# Patient Record
Sex: Female | Born: 1950 | Race: White | Hispanic: No | Smoking: Former smoker
Health system: Southern US, Community
[De-identification: ages and names within clinical notes are randomized; demographics above are authoritative.]

## PROBLEM LIST (undated history)

## (undated) DIAGNOSIS — I10 Essential (primary) hypertension: Secondary | ICD-10-CM

## (undated) DIAGNOSIS — R350 Frequency of micturition: Secondary | ICD-10-CM

## (undated) DIAGNOSIS — M1990 Unspecified osteoarthritis, unspecified site: Secondary | ICD-10-CM

## (undated) HISTORY — DX: Unspecified osteoarthritis, unspecified site: M19.90

## (undated) HISTORY — PX: BREAST EXCISIONAL BIOPSY: SUR124

## (undated) HISTORY — DX: Essential (primary) hypertension: I10

## (undated) HISTORY — PX: BREAST BIOPSY: SHX20

## (undated) HISTORY — PX: BREAST CYST EXCISION: SHX579

## (undated) HISTORY — PX: BREAST SURGERY: SHX581

## (undated) HISTORY — PX: APPENDECTOMY: SHX54

## (undated) HISTORY — PX: TONSILLECTOMY: SUR1361

---

## 1997-10-26 ENCOUNTER — Ambulatory Visit (HOSPITAL_COMMUNITY): Admission: RE | Admit: 1997-10-26 | Discharge: 1997-10-26 | Payer: Self-pay | Admitting: Obstetrics & Gynecology

## 1998-05-04 ENCOUNTER — Other Ambulatory Visit: Admission: RE | Admit: 1998-05-04 | Discharge: 1998-05-04 | Payer: Self-pay | Admitting: Obstetrics & Gynecology

## 1999-06-24 ENCOUNTER — Other Ambulatory Visit: Admission: RE | Admit: 1999-06-24 | Discharge: 1999-06-24 | Payer: Self-pay | Admitting: Obstetrics & Gynecology

## 2000-07-22 ENCOUNTER — Other Ambulatory Visit: Admission: RE | Admit: 2000-07-22 | Discharge: 2000-07-22 | Payer: Self-pay | Admitting: Obstetrics & Gynecology

## 2001-08-04 ENCOUNTER — Other Ambulatory Visit: Admission: RE | Admit: 2001-08-04 | Discharge: 2001-08-04 | Payer: Self-pay | Admitting: Obstetrics & Gynecology

## 2001-09-22 ENCOUNTER — Other Ambulatory Visit: Admission: RE | Admit: 2001-09-22 | Discharge: 2001-09-22 | Payer: Self-pay | Admitting: Urology

## 2002-08-24 ENCOUNTER — Other Ambulatory Visit: Admission: RE | Admit: 2002-08-24 | Discharge: 2002-08-24 | Payer: Self-pay | Admitting: Obstetrics & Gynecology

## 2003-09-21 ENCOUNTER — Other Ambulatory Visit: Admission: RE | Admit: 2003-09-21 | Discharge: 2003-09-21 | Payer: Self-pay | Admitting: Obstetrics & Gynecology

## 2004-08-14 ENCOUNTER — Encounter: Admission: RE | Admit: 2004-08-14 | Discharge: 2004-08-14 | Payer: Self-pay | Admitting: Obstetrics & Gynecology

## 2004-11-06 ENCOUNTER — Other Ambulatory Visit: Admission: RE | Admit: 2004-11-06 | Discharge: 2004-11-06 | Payer: Self-pay | Admitting: Obstetrics & Gynecology

## 2007-02-19 ENCOUNTER — Encounter: Admission: RE | Admit: 2007-02-19 | Discharge: 2007-02-19 | Payer: Self-pay | Admitting: Obstetrics & Gynecology

## 2007-03-01 ENCOUNTER — Encounter (INDEPENDENT_AMBULATORY_CARE_PROVIDER_SITE_OTHER): Payer: Self-pay | Admitting: Diagnostic Radiology

## 2007-03-01 ENCOUNTER — Encounter: Admission: RE | Admit: 2007-03-01 | Discharge: 2007-03-01 | Payer: Self-pay | Admitting: Obstetrics & Gynecology

## 2008-12-30 IMAGING — MG MM DIAGNOSTIC UNILATERAL R
2 series · 2 of 2 positions shown · non-contrast
Comparison: none

DG DIAGNOSTIC UNILATERAL R
CC and MLO view(s) were taken of the right breast.

DIGITAL UNILATERAL RIGHT DIAGNOSTIC MAMMOGRAM:
CLINICAL DATA: Abnormal screening study from [REDACTED] dated 02-10-07.

[R CC]
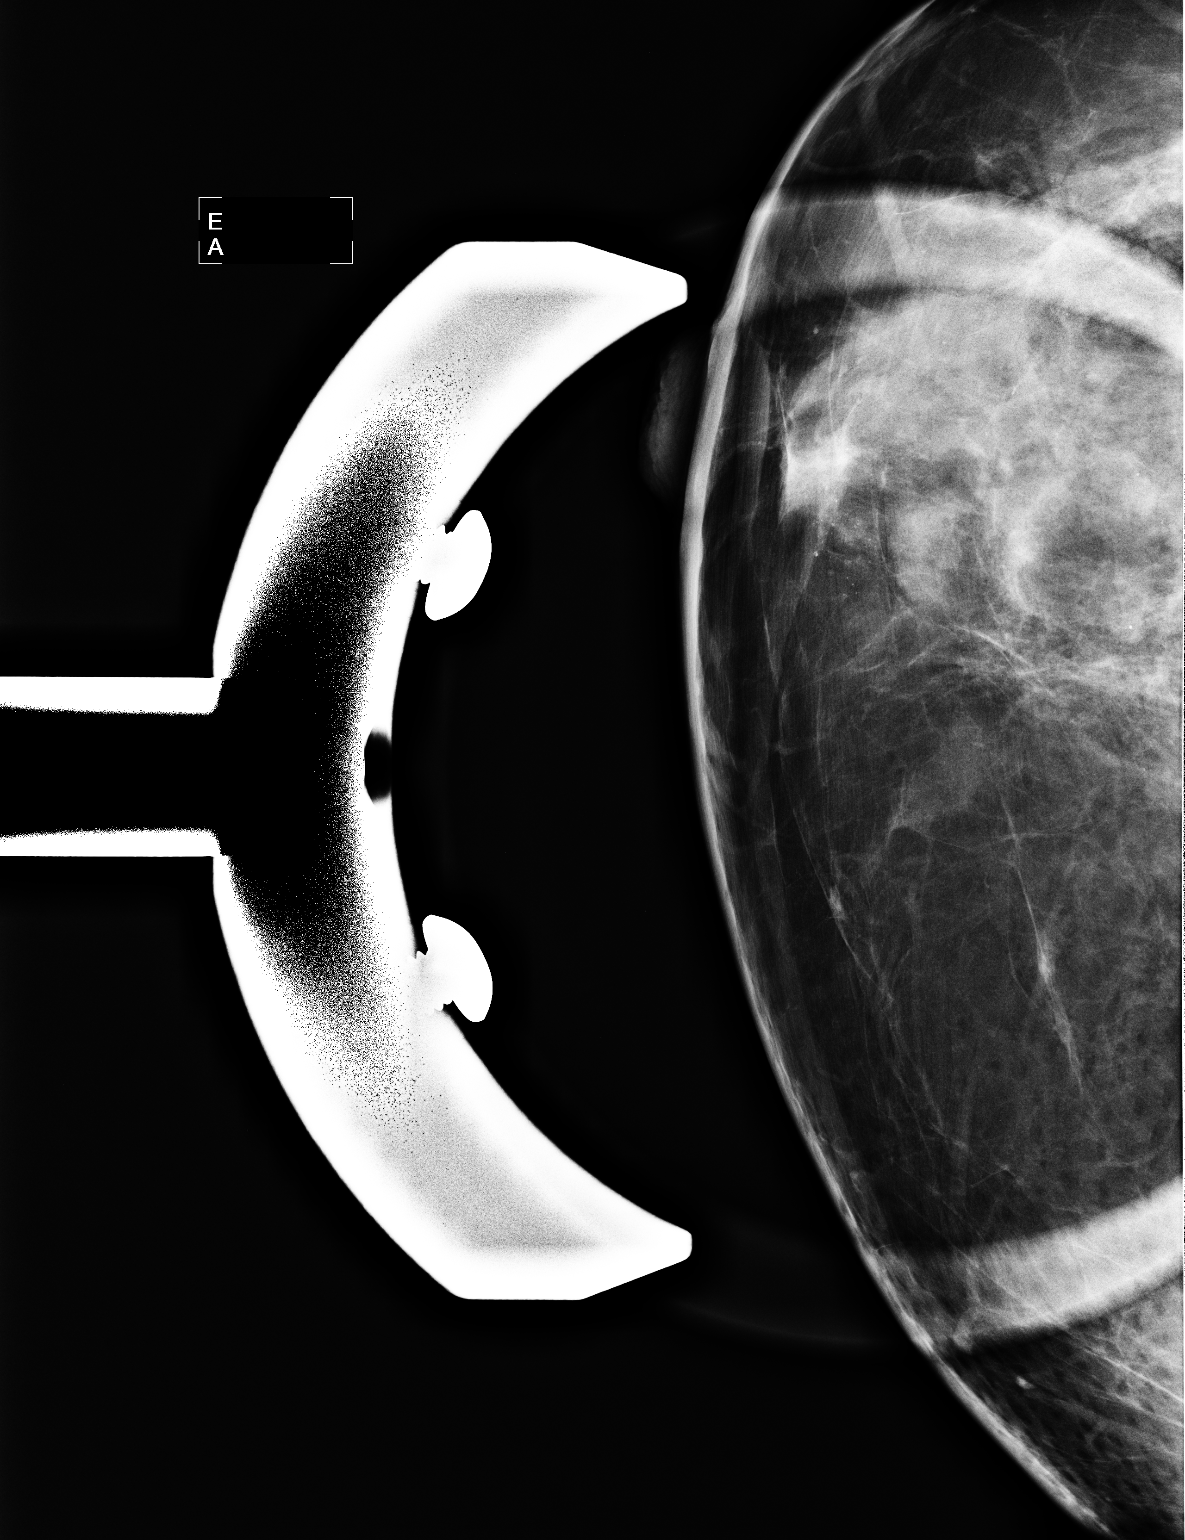

[R MLO]
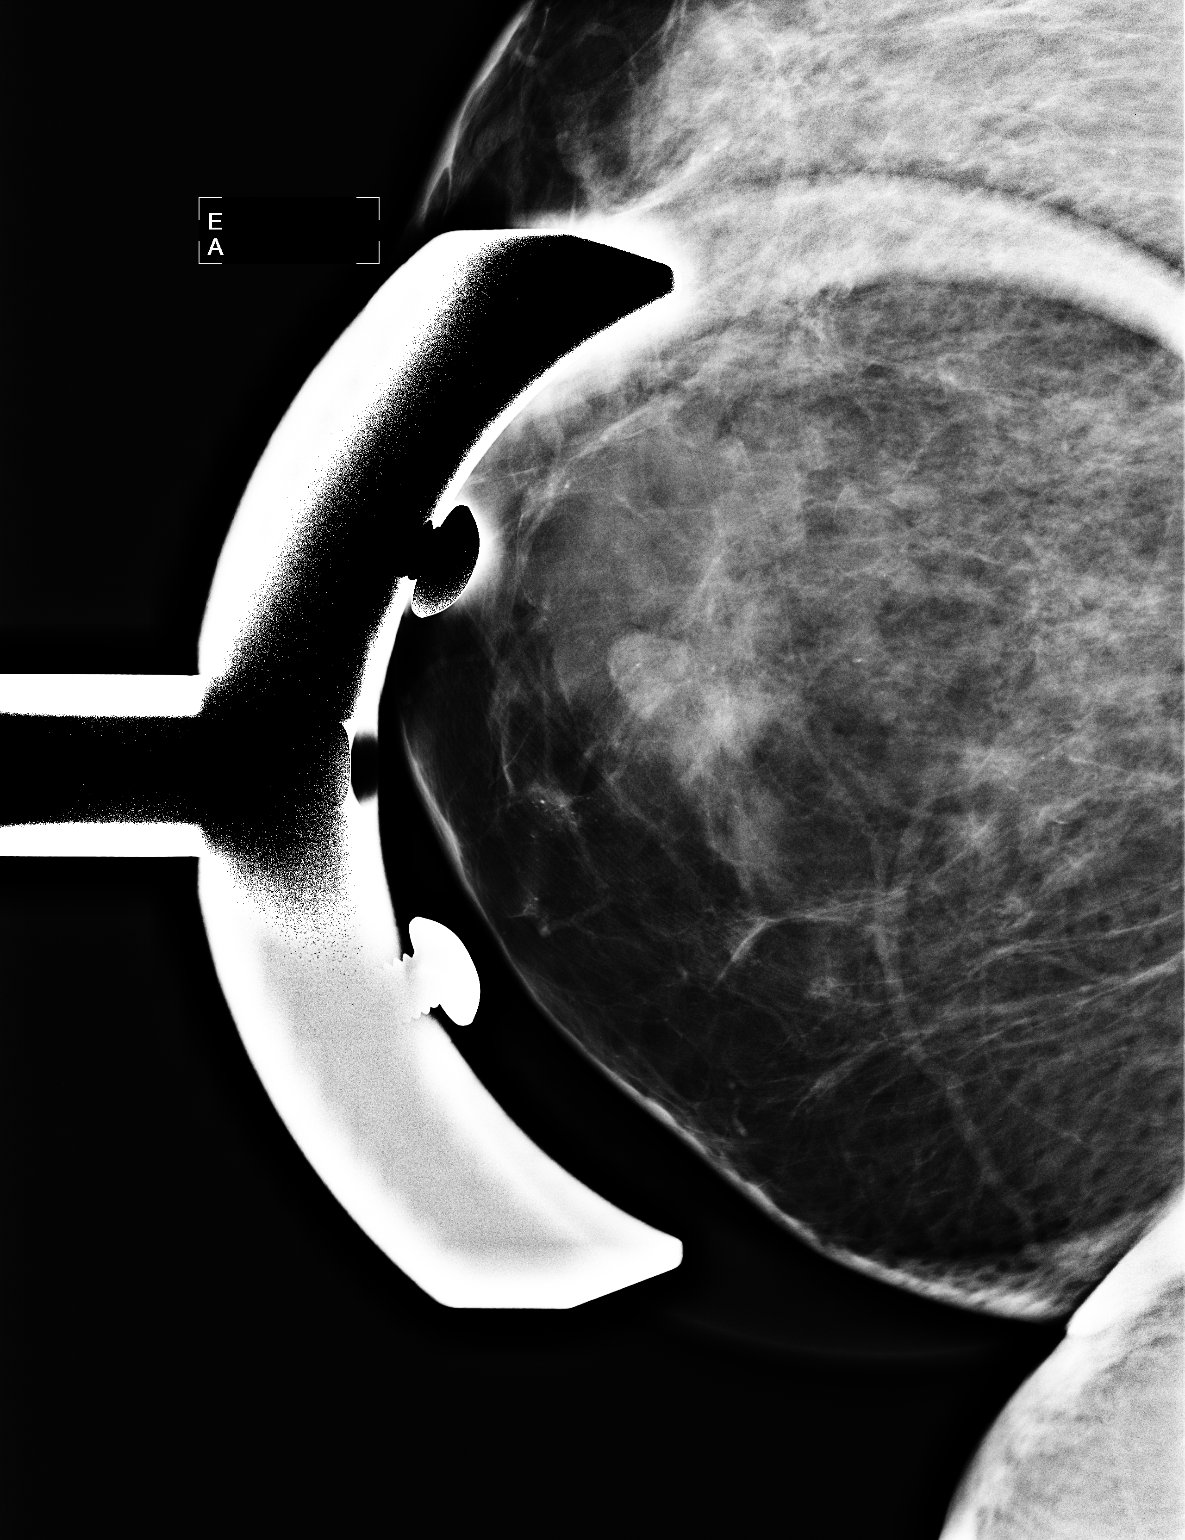

[2 of 2 positions shown; findings below may reference images not displayed]

Magnification views of the lower inner quadrant in the right breast was performed. There is a 
cluster of calcifications.  They vary in size and shape and are concerning for ductal carcinoma in 
situ.  There is no associated mass.  Comparison studies are recent screening mammogram dated 
02-10-07 and prior studies dated 09-16-05 and 07-24-04.
IMPRESSION: Suspicious calcifications, right breast.  Tissue sampling is recommended.  Stereotactic biopsy will
be scheduled.  Findings were discussed with the patient and called to Dr. [REDACTED].

ASSESSMENT: Suspicious - BI-RADS 4

Needle biopsy of the right breast.
, THIS PROCEDURE WAS A DIGITAL MAMMOGRAM.

## 2011-07-09 ENCOUNTER — Other Ambulatory Visit: Payer: Self-pay | Admitting: Obstetrics & Gynecology

## 2011-07-09 DIAGNOSIS — R928 Other abnormal and inconclusive findings on diagnostic imaging of breast: Secondary | ICD-10-CM

## 2011-07-14 ENCOUNTER — Other Ambulatory Visit: Payer: Self-pay | Admitting: Obstetrics & Gynecology

## 2011-07-14 ENCOUNTER — Ambulatory Visit
Admission: RE | Admit: 2011-07-14 | Discharge: 2011-07-14 | Disposition: A | Discharge status: Home / Self Care | Payer: BC Managed Care – PPO | Source: Ambulatory Visit | Attending: Obstetrics & Gynecology | Admitting: Obstetrics & Gynecology

## 2011-07-14 DIAGNOSIS — R928 Other abnormal and inconclusive findings on diagnostic imaging of breast: Secondary | ICD-10-CM

## 2012-07-20 ENCOUNTER — Other Ambulatory Visit: Payer: Self-pay | Admitting: Obstetrics & Gynecology

## 2012-07-20 DIAGNOSIS — R928 Other abnormal and inconclusive findings on diagnostic imaging of breast: Secondary | ICD-10-CM

## 2012-08-03 ENCOUNTER — Ambulatory Visit
Admission: RE | Admit: 2012-08-03 | Discharge: 2012-08-03 | Disposition: A | Discharge status: Home / Self Care | Payer: BC Managed Care – PPO | Source: Ambulatory Visit | Attending: Obstetrics & Gynecology | Admitting: Obstetrics & Gynecology

## 2012-08-03 DIAGNOSIS — R928 Other abnormal and inconclusive findings on diagnostic imaging of breast: Secondary | ICD-10-CM

## 2013-05-03 ENCOUNTER — Other Ambulatory Visit: Payer: Self-pay

## 2013-05-03 DIAGNOSIS — Z1231 Encounter for screening mammogram for malignant neoplasm of breast: Secondary | ICD-10-CM

## 2013-07-13 ENCOUNTER — Ambulatory Visit
Admission: RE | Admit: 2013-07-13 | Discharge: 2013-07-13 | Disposition: A | Discharge status: Home / Self Care | Payer: BC Managed Care – PPO | Source: Ambulatory Visit

## 2013-07-13 DIAGNOSIS — Z1231 Encounter for screening mammogram for malignant neoplasm of breast: Secondary | ICD-10-CM

## 2014-09-27 ENCOUNTER — Other Ambulatory Visit: Payer: Self-pay | Admitting: Obstetrics & Gynecology

## 2014-09-28 LAB — CYTOLOGY - PAP

## 2014-09-29 ENCOUNTER — Other Ambulatory Visit: Payer: Self-pay | Admitting: Obstetrics & Gynecology

## 2014-09-29 DIAGNOSIS — R928 Other abnormal and inconclusive findings on diagnostic imaging of breast: Secondary | ICD-10-CM

## 2014-10-04 ENCOUNTER — Ambulatory Visit
Admission: RE | Admit: 2014-10-04 | Discharge: 2014-10-04 | Disposition: A | Discharge status: Home / Self Care | Payer: Medicare Other | Source: Ambulatory Visit | Attending: Obstetrics & Gynecology | Admitting: Obstetrics & Gynecology

## 2014-10-04 DIAGNOSIS — R928 Other abnormal and inconclusive findings on diagnostic imaging of breast: Secondary | ICD-10-CM

## 2015-05-17 HISTORY — PX: KNEE ARTHROSCOPY W/ MENISCECTOMY: SHX1879

## 2015-06-01 ENCOUNTER — Ambulatory Visit: Payer: 59 | Attending: Specialist | Admitting: Physical Therapy

## 2015-06-01 ENCOUNTER — Encounter: Payer: Self-pay | Admitting: Physical Therapy

## 2015-06-01 DIAGNOSIS — R262 Difficulty in walking, not elsewhere classified: Secondary | ICD-10-CM | POA: Insufficient documentation

## 2015-06-01 DIAGNOSIS — M25562 Pain in left knee: Secondary | ICD-10-CM | POA: Diagnosis present

## 2015-06-01 DIAGNOSIS — M25662 Stiffness of left knee, not elsewhere classified: Secondary | ICD-10-CM | POA: Diagnosis present

## 2015-06-01 NOTE — Therapy (Signed)
[facility] [address]  [address] [place], [place], [address] Phone: [phone]   Fax:  [phone]  Physical Therapy Evaluation  Patient Details  Name: Carly Mitchell MRN: [id number] Date of Birth: May 12, 1950 Referring Provider: R. Valma Cava, MD  Encounter Date: 06/01/2015      PT End of Session - 06/01/15 0943    Visit Number 1   Number of Visits 12   Date for PT Re-Evaluation 07/13/15   PT Start Time 0941   PT Stop Time 1030   PT Time Calculation (min) 49 min      Past Medical History  Diagnosis Date  . Arthritis   . Hypertension     Past Surgical History  Procedure Laterality Date  . Knee arthroscopy w/ meniscectomy Left 05/17/15    There were no vitals filed for this visit.  Visit Diagnosis:  Left knee pain  Difficulty walking  Knee stiffness, left      Subjective Assessment - 06/01/15 0952    Subjective Pt with long history of L knee pain.  Underwent arthroscopic medial menisectomy on 05/17/15.  Was given exercises to perform and has been performing these (pt describes exercises and seem ismetric in nature).  States progressed from RW to [facility] and now to no AD with ambulation.   Pertinent History R knee meniscus tear   Patient Stated Goals get back to walking without limp   Currently in Pain? Yes   Pain Score --  AVG pain 4-5/10, worst pain noted upon waking in AM rating 6-7/10   Pain Location Knee   Pain Orientation Left;Posterior;Anterior  posterior > anterior   Pain Onset More than a month ago   Pain Frequency Intermittent   Aggravating Factors  prolonged standing and walking; movement after prolonged rest   Pain Relieving Factors sitting            [facility] PT Assessment - 06/01/15 0001    Assessment   Medical Diagnosis s/p L knee partial medial menisectomy   Referring Provider R. Valma Cava, MD   Onset Date/Surgical Date 05/17/15   Next MD Visit 07/28/15   Balance Screen   Has  the patient fallen in the past 6 months No   Has the patient had a decrease in activity level because of a fear of falling?  No   Is the patient reluctant to leave their home because of a fear of falling?  No   Home Environment   Living Environment Private residence   Living Arrangements Alone   Type of Home House   Home Layout Two level  employing step-to gait   Prior Function   Vocation Retired  teaches some on-line classes   Leisure read and knit; was attending water aerobics 3x/wk prior to surgery and wants to get back to this   Observation/Other Assessments   Focus on Therapeutic Outcomes (FOTO)  59% limitation   Functional Tests   Functional tests Squat   Squat   Comments 50% parallel with mild wt shift to R and c/o mild increased L knee pain   ROM / Strength   AROM / PROM / Strength AROM;PROM;Strength   AROM   Overall AROM Comments L Knee 8-130   PROM   Overall PROM Comments L knee 4-134   Strength   Strength Assessment Site Knee;Hip   Right/Left Hip Left   Left Hip Flexion 4+/5   Left Hip Extension 4+/5   Left Hip External Rotation 4/5  Left Hip ABduction 4/5   Left Hip ADduction 5/5   Right/Left Knee Left   Left Knee Flexion 4/5   Left Knee Extension 4/5         TODAY'S TREATMENT TherEx - Bridge 10x QS 10x5" Heel Slide 10x Seated HS Stretch Seated LAQ 10x  3 strips kinesiotape to L anterior knee: 25% medial and lateral to patella, 75% at anterior joint line         PT Education - 2015/06/02 1138    Education provided Yes   Education Details Initial HEP   Person(s) Educated Patient   Methods Explanation;Demonstration;Handout   Comprehension Verbalized understanding;Returned demonstration          PT Short Term Goals - 06/02/15 1145    PT SHORT TERM GOAL #1   Title independent with initial HEP by 06/15/15   Status New           PT Long Term Goals - June 02, 2015 1145    PT LONG TERM GOAL #1   Title pt able to return to water aerobics  without restiction by 07/13/15   Status New   PT LONG TERM GOAL #2   Title pt independent with advanced HEP as necessary for continued LE progression by 07/13/15   Status New   PT LONG TERM GOAL #3   Title pt ambulates with good mechanics over level and uneven terrain to include up/down stairs with recip gait and no more than single rail use by 07/13/15   Status New   PT LONG TERM GOAL #4   Title R LE MMT 4+/5 or better grossly without pain by 07/13/15               Plan - 06-02-15 1139    Clinical Impression Statement Pt with long history of L knee pain recently underwent scope with medial menisectomy on 05/17/15.  Since surgery she has progressed to amulating without use of AD.  Her gait is only mildly antalgic at this point but some excessive flexion is noted at heel strike and mild limitation in flexion throughout stance.  L LE MMT at knee and hip 4/5 to 4+/5 grossly only noting mild pain with knee Flexion and Extension.  L Knee AROM 8-130 and PROM 4-134. She is currently ascending/descending stairs at home with step-to gait.  She should progress well with PT focusing on strengthening and improving function as able.   Pt will benefit from skilled therapeutic intervention in order to improve on the following deficits Pain;Abnormal gait;Decreased strength;Decreased activity tolerance;Decreased range of motion;Difficulty walking   Rehab Potential Good   PT Frequency 2x / week   PT Duration 6 weeks   PT Treatment/Interventions Therapeutic exercise;Manual techniques;Therapeutic activities;Electrical Stimulation;Cryotherapy;Taping;Vasopneumatic Device;Balance training;Gait training;Stair training;Functional mobility training;Patient/family education   PT Next Visit Plan OKC quad and HS strengthening with some low intensity CKC as well.  Hip Stability and strengthening to tolerance.  Gait and balance PRN.  Progress to step training when minimal to no pain with this.   Consulted and Agree with Plan of  Care Patient          G-Codes - 06/02/2015 1147    Functional Assessment Tool Used FOTO 59% limitaion   Functional Limitation Mobility: Walking and moving around   Mobility: Walking and Moving Around Current Status [phone]) At least 40 percent but less than 60 percent impaired, limited or restricted   Mobility: Walking and Moving Around Goal Status (U0454) At least 20 percent but less than 40 percent impaired,  limited or restricted       Problem List There are no active problems to display for this patient.   Desiray Orchard PT, OCS 06/01/2015, 11:50 AM  [facility] [address]  [address] [place], [place], [address] Phone: [phone]   Fax:  [phone]  Name: Carly Mitchell MRN: [id number] Date of Birth: 1950/08/21

## 2015-06-05 ENCOUNTER — Ambulatory Visit: Payer: 59 | Admitting: Physical Therapy

## 2015-06-05 DIAGNOSIS — M25662 Stiffness of left knee, not elsewhere classified: Secondary | ICD-10-CM

## 2015-06-05 DIAGNOSIS — M25562 Pain in left knee: Secondary | ICD-10-CM | POA: Diagnosis not present

## 2015-06-05 DIAGNOSIS — R262 Difficulty in walking, not elsewhere classified: Secondary | ICD-10-CM

## 2015-06-05 NOTE — Therapy (Signed)
[facility] [address]  [address] [place], [place], [address] Phone: [phone]   Fax:  [phone]  Physical Therapy Treatment  Patient Details  Name: Carly Mitchell MRN: [id number] Date of Birth: Feb 19, 1951 Referring Provider: R. Valma Cava, MD  Encounter Date: 06/05/2015      PT End of Session - 06/05/15 1318    Visit Number 2   Number of Visits 12   Date for PT Re-Evaluation 07/13/15   PT Start Time 1316   PT Stop Time 1358   PT Time Calculation (min) 42 min      Past Medical History  Diagnosis Date  . Arthritis   . Hypertension     Past Surgical History  Procedure Laterality Date  . Knee arthroscopy w/ meniscectomy Left 05/17/15    There were no vitals filed for this visit.  Visit Diagnosis:  Left knee pain  Difficulty walking  Knee stiffness, left      Subjective Assessment - 06/05/15 1324    Subjective states feels as though is getting better every day; rates pain 3/10 today.   Currently in Pain? Yes   Pain Score 3    Pain Location Knee   Pain Orientation Left           TODAY'S TREATMENT TherEx - Rec Bike lvl 1, 4' DL Leg Press 69# 6E95 Stretch B HS and Piri  Manual - STM to L RF and L patellar inferior glides grade 3 in Mod Thomas stretch  TherEx - Hooklying B Hip ABD Black TB 10x (c/o pain in R toes) Bridge with Black TB at knees 10x3" (c/o pain in R toes) Knee Flexion Machine 20# 2x10 (notes pulling and mild pain in R knee) Knee Extension Machine 10# 10x (c/o clicking but no pain in L knee) B SAQ 3# 15x3" SLR 10x each SL-ing Hip ABD 10 each (weak)                   PT Short Term Goals - 06/05/15 1346    PT SHORT TERM GOAL #1   Title independent with initial HEP by 06/15/15   Status Achieved           PT Long Term Goals - 06/05/15 1347    PT LONG TERM GOAL #1   Title pt able to return to water aerobics without restiction by 07/13/15   Status On-going    PT LONG TERM GOAL #2   Title pt independent with advanced HEP as necessary for continued LE progression by 07/13/15   Status On-going   PT LONG TERM GOAL #3   Title pt ambulates with good mechanics over level and uneven terrain to include up/down stairs with recip gait and no more than single rail use by 07/13/15   Status On-going   PT LONG TERM GOAL #4   Title R LE MMT 4+/5 or better grossly without pain by 07/13/15   Status On-going               Plan - 06/05/15 1353    Clinical Impression Statement initial treatment went well today without c/o increased L knee pain throughout treatment; however, notes cramping pain in R foot with a lot of CKC exercises stating due to hammer toe and c/o intermittent R knee pain with some OKC due to torn meniscuc.  Perfomed LE exercises bilaterally as able to try to improve R knee pain and function.   PT Next Visit Plan  OKC quad and HS strengthening with some low intensity CKC as well.  Hip Stability and strengthening to tolerance.  Gait and balance PRN.  Progress to step training when minimal to no pain with this.   Consulted and Agree with Plan of Care Patient        Problem List There are no active problems to display for this patient.   Azuree Minish PT, OCS 06/05/2015, 2:00 PM  [facility] [address]  [address] [place], [place], [address] Phone: [phone]   Fax:  [phone]  Name: Carly Mitchell MRN: [id number] Date of Birth: 29-Nov-1950

## 2015-06-08 ENCOUNTER — Ambulatory Visit: Payer: 59 | Attending: Specialist | Admitting: Physical Therapy

## 2015-06-08 DIAGNOSIS — M25662 Stiffness of left knee, not elsewhere classified: Secondary | ICD-10-CM

## 2015-06-08 DIAGNOSIS — R262 Difficulty in walking, not elsewhere classified: Secondary | ICD-10-CM | POA: Insufficient documentation

## 2015-06-08 DIAGNOSIS — M25562 Pain in left knee: Secondary | ICD-10-CM | POA: Insufficient documentation

## 2015-06-08 NOTE — Therapy (Signed)
[facility] [address]  [address] [place], [place], [address] Phone: [phone]   Fax:  [phone]  Physical Therapy Treatment  Patient Details  Name: Carly Mitchell MRN: [id number] Date of Birth: [birth year] Referring Provider: R. Valma CavaAndrew Collins, MD  Encounter Date: 06/08/2015      PT End of Session - 06/08/15 0930    Visit Number 3   Number of Visits 12   Date for PT Re-Evaluation 07/13/15   PT Start Time 0928   PT Stop Time 1010   PT Time Calculation (min) 42 min      Past Medical History  Diagnosis Date  . Arthritis   . Hypertension     Past Surgical History  Procedure Laterality Date  . Knee arthroscopy w/ meniscectomy Left 05/17/15    There were no vitals filed for this visit.  Visit Diagnosis:  Left knee pain  Difficulty walking  Knee stiffness, left      Subjective Assessment - 06/08/15 0931    Subjective Continues to note improvement, states was sore when first woke this AM but once up and moving around feeling better and rates pain 2/10 this AM.   Currently in Pain? Yes   Pain Score 2    Pain Location Knee   Pain Orientation Left           TODAY'S TREATMENT TherEx - Rec Bike lvl 1, 4' Stretch B HS and Piri  Manual - STM to L RF and L patellar inferior glides grade 3 in Mod Thomas stretch  TherEx - Bridge with Black TB at knees 15x3" (c/o pain in R toes) B SAQ 4# 15x3" B FOB (peanut) Bridge 10x3" SL-ing Hip ABD 12x each (weaker on R today) Standing Hip Flexion Red TB 12x L Standing Hip Extension Red TB 12x L DL Leg Press 10#20# 27O20x Seated Hip Clam Black TB 15x  3 strips kinesiotape to L anterior knee: 25% medial and lateral to patella, 75% at anterior joint line           PT Short Term Goals - 06/05/15 1346    PT SHORT TERM GOAL #1   Title independent with initial HEP by 06/15/15   Status Achieved           PT Long Term Goals - 06/05/15 1347    PT LONG TERM GOAL  #1   Title pt able to return to water aerobics without restiction by 07/13/15   Status On-going   PT LONG TERM GOAL #2   Title pt independent with advanced HEP as necessary for continued LE progression by 07/13/15   Status On-going   PT LONG TERM GOAL #3   Title pt ambulates with good mechanics over level and uneven terrain to include up/down stairs with recip gait and no more than single rail use by 07/13/15   Status On-going   PT LONG TERM GOAL #4   Title R LE MMT 4+/5 or better grossly without pain by 07/13/15   Status On-going               Plan - 06/08/15 1011    Clinical Impression Statement is progressing well with regard to pain, 3rd visit completed today. Is quite weak in B hips and hit or miss with exercise tolerance when it comes to R foot pain (hammer toe and plantar faciitis). Reports benefit from taping so continued with this today.   PT Next Visit Plan OKC quad and HS  strengthening with some low intensity CKC as well.  Hip Stability and strengthening to tolerance.  Gait and balance PRN.  Progress to step training when minimal to no pain with this.   Consulted and Agree with Plan of Care Patient        Problem List There are no active problems to display for this patient.   Carly Mitchell PT, OCS 06/08/2015, 10:13 AM  [facility] [address]  [address] [place], [place], [address] Phone: [phone]   Fax:  [phone]  Name: Carly Mitchell MRN: [id number] Date of Birth: 1950/06/11

## 2015-06-12 ENCOUNTER — Ambulatory Visit: Payer: 59

## 2015-06-12 DIAGNOSIS — M25562 Pain in left knee: Secondary | ICD-10-CM | POA: Diagnosis not present

## 2015-06-12 DIAGNOSIS — M25662 Stiffness of left knee, not elsewhere classified: Secondary | ICD-10-CM

## 2015-06-12 DIAGNOSIS — R262 Difficulty in walking, not elsewhere classified: Secondary | ICD-10-CM

## 2015-06-12 NOTE — Therapy (Signed)
[facility] [address]  [address] [place], [place], [address] Phone: [phone]   Fax:  [phone]  Physical Therapy Treatment  Patient Details  Name: ZYKERA ABELLA MRN: [id number] Date of Birth: [birth year] Referring Provider: R. Valma Cava, MD  Encounter Date: 06/12/2015      PT End of Session - 06/12/15 1157    Visit Number 4   Number of Visits 12   Date for PT Re-Evaluation 07/13/15   PT Start Time 1105   PT Stop Time 1147   PT Time Calculation (min) 42 min   Activity Tolerance Patient tolerated treatment well   Behavior During Therapy [facility] for tasks assessed/performed      Past Medical History  Diagnosis Date  . Arthritis   . Hypertension     Past Surgical History  Procedure Laterality Date  . Knee arthroscopy w/ meniscectomy Left 05/17/15    There were no vitals filed for this visit.  Visit Diagnosis:  Left knee pain  Difficulty walking  Knee stiffness, left      Subjective Assessment - 06/12/15 1107    Subjective Pt. reports 2/10 L knee pain with movement.  No other pain or complaints reported.     Currently in Pain? Yes   Pain Score 2    Pain Location Knee   Pain Orientation Left;Medial   Pain Descriptors / Indicators Aching   Pain Onset More than a month ago   Pain Frequency Occasional   Aggravating Factors  prolonged standing and walking; movement after prolonged rest     Multiple Pain Sites No       TODAY'S TREATMENT  TherEx:  NuStep level 2 3 min  Stretch B HS and Piri x 30 sec each   Manual - L patellar inferior glides grade 3 in Mod Thomas stretch  TherEx: Bridge x 10 reps  Side lying clam shell with blue TB  Bridge with alternating isometrics / ER 3 x 5 reps Bridge with Black TB at knees 10x3" (c/o pain in R toes) Knee Flexion Machine 20# 2x10 (notes pulling and mild pain in R knee) Knee Extension Machine 15# 10x (c/o clicking but no pain in L knee)        PT Short  Term Goals - 06/05/15 1346    PT SHORT TERM GOAL #1   Title independent with initial HEP by 06/15/15   Status Achieved           PT Long Term Goals - 06/05/15 1347    PT LONG TERM GOAL #1   Title pt able to return to water aerobics without restiction by 07/13/15   Status On-going   PT LONG TERM GOAL #2   Title pt independent with advanced HEP as necessary for continued LE progression by 07/13/15   Status On-going   PT LONG TERM GOAL #3   Title pt ambulates with good mechanics over level and uneven terrain to include up/down stairs with recip gait and no more than single rail use by 07/13/15   Status On-going   PT LONG TERM GOAL #4   Title R LE MMT 4+/5 or better grossly without pain by 07/13/15   Status On-going           Plan - 06/12/15 1157    Clinical Impression Statement Pt. with 2/10 L knee pain initially and 0/10 L knee pain following OKC hip / knee strengthening.  Pt. R foot with mild pain following some supine  exercise (no pattern with pain connection to any particular exrecise).     PT Next Visit Plan OKC quad and HS strengthening with some low intensity CKC as well.  Hip Stability and strengthening to tolerance.  Gait and balance PRN.  Progress to step training when minimal to no pain with this.     Problem List There are no active problems to display for this patient.   Kermit BaloMicah Sydell Prowell, PTA 06/12/2015, 12:06 PM  [facility] [address]  [address] [place], [place], [address] Phone: [phone]   Fax:  [phone]  Name: Celestia KhatJanet C Fryberger MRN: [id number] Date of Birth: 08-28-1950

## 2015-06-15 ENCOUNTER — Ambulatory Visit: Payer: 59

## 2015-06-15 DIAGNOSIS — M25562 Pain in left knee: Secondary | ICD-10-CM

## 2015-06-15 DIAGNOSIS — R262 Difficulty in walking, not elsewhere classified: Secondary | ICD-10-CM

## 2015-06-15 DIAGNOSIS — M25662 Stiffness of left knee, not elsewhere classified: Secondary | ICD-10-CM

## 2015-06-15 NOTE — Therapy (Signed)
[facility] [address]  [address] [place], [place], [address] Phone: [phone]   Fax:  [phone]  Physical Therapy Treatment  Patient Details  Name: Carly Mitchell MRN: [id number] Date of Birth: December 03, 1950 Referring Provider: R. Valma CavaAndrew Collins, MD  Encounter Date: 06/15/2015      PT End of Session - 06/15/15 1304    Visit Number 5   Number of Visits 12   Date for PT Re-Evaluation 07/13/15   PT Start Time 1020   PT Stop Time 1105   PT Time Calculation (min) 45 min   Activity Tolerance Patient tolerated treatment well   Behavior During Therapy [facility] for tasks assessed/performed      Past Medical History  Diagnosis Date  . Arthritis   . Hypertension     Past Surgical History  Procedure Laterality Date  . Knee arthroscopy w/ meniscectomy Left 05/17/15    There were no vitals filed for this visit.  Visit Diagnosis:  Left knee pain  Difficulty walking  Knee stiffness, left      Subjective Assessment - 06/15/15 1028    Subjective Pt. reports 1/10 L knee pain with movements.  No other pain or complaints reported.     Patient Stated Goals get back to walking without limp   Currently in Pain? Yes   Pain Score 1    Pain Location Knee   Pain Orientation Left;Medial   Pain Descriptors / Indicators Aching   Pain Type Acute pain   Pain Onset More than a month ago   Aggravating Factors  prolonged standing and walking   Multiple Pain Sites No       TODAY'S TREATMENT  TherEx: NuStep: 4 min, level 3  Stretch L HS and Piri Bridge with Blue TB at knees 10x3" (c/o pain in R toes) L SLR x 10 reps  B FOB (peanut) Bridge 10x3" Leg curl machine 2 x 10 reps @ 20# B hip extension on Fitter x 10 reps each side (2 blue bands)  DL Leg Press 10#20# 27O20x Seated Hip Clam Blue TB 15x   Gait training: Ascend / descend stairs; pt. descended stairs with 1 rail use reporting L knee pain 2/10 with WB through L; pt. able to  ascend without rails / pain increase Pt. able to walk with reciprocal gait on level indoors with no AD; pt. gait pattern normalizing with nearly symmetrical step length / stance time B; continues with slight weight shift > R.       PT Short Term Goals - 06/05/15 1346    PT SHORT TERM GOAL #1   Title independent with initial HEP by 06/15/15   Status Achieved           PT Long Term Goals - 06/05/15 1347    PT LONG TERM GOAL #1   Title pt able to return to water aerobics without restiction by 07/13/15   Status On-going   PT LONG TERM GOAL #2   Title pt independent with advanced HEP as necessary for continued LE progression by 07/13/15   Status On-going   PT LONG TERM GOAL #3   Title pt ambulates with good mechanics over level and uneven terrain to include up/down stairs with recip gait and no more than single rail use by 07/13/15   Status On-going   PT LONG TERM GOAL #4   Title R LE MMT 4+/5 or better grossly without pain by 07/13/15   Status On-going  Plan - 06/15/15 1305    Clinical Impression Statement Pt. with 1/10 L knee pain today, 2/10 descending stairs.  Pt. tolerated low intensity CKC hip / knee strengthening well today reporting 0/10 pain with leg press. Pt. gait pattern normalizing with nearly symmetrical step length / stance time B; continues with slight weight shift > R.    PT Next Visit Plan OKC quad and HS strengthening with some low intensity CKC as well.  Hip Stability and strengthening to tolerance.  Gait and balance PRN.  Progress to step training when minimal to no pain with this.       Problem List There are no active problems to display for this patient.   Kermit Balo, PTA 06/15/2015, 1:13 PM  [facility] [address]  [address] [place], [place], [address] Phone: [phone]   Fax:  [phone]  Name: Carly Mitchell MRN: [id number] Date of Birth: Jun 02, 1950

## 2015-06-19 ENCOUNTER — Ambulatory Visit: Payer: 59 | Admitting: Physical Therapy

## 2015-06-19 DIAGNOSIS — M25662 Stiffness of left knee, not elsewhere classified: Secondary | ICD-10-CM

## 2015-06-19 DIAGNOSIS — R262 Difficulty in walking, not elsewhere classified: Secondary | ICD-10-CM

## 2015-06-19 DIAGNOSIS — M25562 Pain in left knee: Secondary | ICD-10-CM

## 2015-06-19 NOTE — Therapy (Signed)
[facility] [address]  [address] [place], [place], [address] Phone: [phone]   Fax:  [phone]  Physical Therapy Treatment  Patient Details  Name: Carly Mitchell MRN: [id number] Date of Birth: 13-Sep-1950 Referring Provider: R. Valma Cava, MD  Encounter Date: 06/19/2015      PT End of Session - 06/19/15 1451    Visit Number 6   Number of Visits 12   Date for PT Re-Evaluation 07/13/15   PT Start Time 1402   PT Stop Time 1450   PT Time Calculation (min) 48 min      Past Medical History  Diagnosis Date  . Arthritis   . Hypertension     Past Surgical History  Procedure Laterality Date  . Knee arthroscopy w/ meniscectomy Left 05/17/15    There were no vitals filed for this visit.  Visit Diagnosis:  Left knee pain  Difficulty walking  Knee stiffness, left      Subjective Assessment - 06/19/15 1408    Subjective States has gone to water aerobics 2x's in past week.  States has been getting good workout with this without noting increased knee pain.  States knee is typically stiff and sore in the AM but this goes away once up and on feet.   Currently in Pain? Yes   Pain Score --  1-2/10   Pain Location Knee   Pain Orientation Left      TODAY'S TREATMENT TherEx - NuStep lvl 4, 4' Seated B Hip ABD Black TB 15x  Manual - seated R patellar inferior glide grade 3 and 4  STM to L RF and L patellar inferior glides grade 3 in Mod Thomas stretch  Standing Hip ABD Yellow TB at Ankles 15x each Standing 4" step-up 10x3" with L, 4x3" with R then stopped due to R Knee pain Leg Press DL 69# 62X, L 52# 84X Stretch B HS and Piri B SAQ 5# 15x3" Bridge with ALT Knee Ext 8x each SL'ing clam Black TB 12x each  Manual - Mod Thomas RF Stretch with Strumming to RF           PT Education - 06/19/15 1451    Education provided Yes   Education Details added standing hip ABD to HEP with Yellow TB   Person(s)  Educated Patient   Methods Explanation;Demonstration   Comprehension Returned demonstration;Verbalized understanding          PT Short Term Goals - 06/05/15 1346    PT SHORT TERM GOAL #1   Title independent with initial HEP by 06/15/15   Status Achieved           PT Long Term Goals - 06/05/15 1347    PT LONG TERM GOAL #1   Title pt able to return to water aerobics without restiction by 07/13/15   Status On-going   PT LONG TERM GOAL #2   Title pt independent with advanced HEP as necessary for continued LE progression by 07/13/15   Status On-going   PT LONG TERM GOAL #3   Title pt ambulates with good mechanics over level and uneven terrain to include up/down stairs with recip gait and no more than single rail use by 07/13/15   Status On-going   PT LONG TERM GOAL #4   Title R LE MMT 4+/5 or better grossly without pain by 07/13/15   Status On-going               Plan -  06/19/15 1814    Clinical Impression Statement Is progressing very well with regard to L knee pain and function.  Some difficulty with CKC training but more pain noted in R knee vs L.  Gait much improved but still with mild B lateral sway / Trendelenburg at hips.  Addressing this with hip strengthening as able.   PT Next Visit Plan OKC LE strengthening with some low intensity CKC as tolerated.  Hip Stability and strengthening to tolerance with emphasis on hip ABD.  Gait and balance PRN.   Consulted and Agree with Plan of Care Patient        Problem List There are no active problems to display for this patient.   [facility],Jatinder Mcdonagh PT, OCS 06/19/2015, 6:21 PM  [facility] [place] [address]  [address] [place], [place], [address] Phone: [phone]   Fax:  [phone]  Name: Carly Mitchell MRN: [id number] Date of Birth: Jan 06, 1951

## 2015-06-22 ENCOUNTER — Ambulatory Visit: Payer: 59

## 2015-06-22 DIAGNOSIS — M25662 Stiffness of left knee, not elsewhere classified: Secondary | ICD-10-CM

## 2015-06-22 DIAGNOSIS — M25562 Pain in left knee: Secondary | ICD-10-CM | POA: Diagnosis not present

## 2015-06-22 DIAGNOSIS — R262 Difficulty in walking, not elsewhere classified: Secondary | ICD-10-CM

## 2015-06-22 NOTE — Therapy (Signed)
[facility] [address]  [address] [place], [place], [address] Phone: [phone]   Fax:  [phone]  Physical Therapy Treatment  Patient Details  Name: Carly Mitchell MRN: [id number] Date of Birth: 1950/11/28 Referring Provider: R. Valma Cava, MD  Encounter Date: 06/22/2015      PT End of Session - 06/22/15 1111    Visit Number 7   Number of Visits 12   Date for PT Re-Evaluation 07/13/15   PT Start Time 1016   PT Stop Time 1100   PT Time Calculation (min) 44 min   Activity Tolerance Patient tolerated treatment well   Behavior During Therapy [facility] for tasks assessed/performed      Past Medical History  Diagnosis Date  . Arthritis   . Hypertension     Past Surgical History  Procedure Laterality Date  . Knee arthroscopy w/ meniscectomy Left 05/17/15    There were no vitals filed for this visit.  Visit Diagnosis:  Left knee pain  Difficulty walking  Knee stiffness, left      Subjective Assessment - 06/22/15 1028    Subjective Pt. reports 1/10 L knee pain currently.  No other pain or complaints reported.     Patient Stated Goals get back to walking without limp   Currently in Pain? Yes   Pain Score 1    Pain Location Knee   Pain Orientation Left   Pain Descriptors / Indicators Aching   Pain Type Acute pain   Pain Radiating Towards n/a   Pain Onset More than a month ago   Pain Frequency Occasional   Aggravating Factors  prolonged standing and walking   Pain Relieving Factors sitting   Multiple Pain Sites No     TODAY'S TREATMENT TherEx:  NuStep lvl 3, 4' L HS, PF stretch x 30 sec each  Bridging x 10 reps  SL bridging x 10 reps Bridging x with B hip ER Fitter B hip extension x 10 reps Seated L knee extension / press  (1 blue / 1 black) x 15 reps Leg Press DL 69# x 15 reps, 62# x 15 reps TKE with black TB in door way x 15 reps R HS, PF stretch x 30 sec      PT Short Term Goals - 06/05/15  1346    PT SHORT TERM GOAL #1   Title independent with initial HEP by 06/15/15   Status Achieved           PT Long Term Goals - 06/05/15 1347    PT LONG TERM GOAL #1   Title pt able to return to water aerobics without restiction by 07/13/15   Status On-going   PT LONG TERM GOAL #2   Title pt independent with advanced HEP as necessary for continued LE progression by 07/13/15   Status On-going   PT LONG TERM GOAL #3   Title pt ambulates with good mechanics over level and uneven terrain to include up/down stairs with recip gait and no more than single rail use by 07/13/15   Status On-going   PT LONG TERM GOAL #4   Title R LE MMT 4+/5 or better grossly without pain by 07/13/15   Status On-going               Plan - 06/22/15 1040    Clinical Impression Statement Pt. toleratede all Hip / knee strengthening well today with focus on LLE low intensity CKC strengthening with L TKE  strengthening; no pain increase reported with these activities.  Pt. initial L knee pain 1/10 today; pt. continues to be very pleased with progress with PT.     PT Next Visit Plan OKC LE strengthening with some low intensity CKC as tolerated.  Hip Stability and strengthening to tolerance with emphasis on hip ABD.  Gait and balance PRN.   Consulted and Agree with Plan of Care Patient        Problem List There are no active problems to display for this patient.   Kermit BaloMicah Quentyn Kolbeck, PTA 06/22/2015, 11:18 AM  [facility] [place] [address]  [address] [place], [place], [address] Phone: [phone]   Fax:  [phone]  Name: Carly Mitchell MRN: [id number] Date of Birth: [birth year]

## 2015-06-26 ENCOUNTER — Ambulatory Visit: Payer: 59

## 2015-06-26 DIAGNOSIS — R262 Difficulty in walking, not elsewhere classified: Secondary | ICD-10-CM

## 2015-06-26 DIAGNOSIS — M25662 Stiffness of left knee, not elsewhere classified: Secondary | ICD-10-CM

## 2015-06-26 DIAGNOSIS — M25562 Pain in left knee: Secondary | ICD-10-CM

## 2015-06-26 NOTE — Therapy (Signed)
[facility] [address]  [address] [place], [place], [address] Phone: [phone]   Fax:  [phone]  Physical Therapy Treatment  Patient Details  Name: Carly Mitchell MRN: [id number] Date of Birth: [birth year] Referring Provider: R. Valma Cava, MD  Encounter Date: 06/26/2015      PT End of Session - 06/26/15 1626    Visit Number 8   Number of Visits 12   Date for PT Re-Evaluation 07/13/15   PT Start Time 1406   PT Stop Time 1451   PT Time Calculation (min) 45 min   Activity Tolerance Patient tolerated treatment well   Behavior During Therapy [facility] for tasks assessed/performed      Past Medical History  Diagnosis Date  . Arthritis   . Hypertension     Past Surgical History  Procedure Laterality Date  . Knee arthroscopy w/ meniscectomy Left 05/17/15    There were no vitals filed for this visit.  Visit Diagnosis:  Left knee pain  Difficulty walking  Knee stiffness, left      Subjective Assessment - 06/26/15 1621    Subjective Pt. reports 0/10 L knee pain surrently.  Pt. reports she walked around the block yesterday and feels fine today.    Currently in Pain? No/denies   Pain Score 0-No pain   Multiple Pain Sites No     TODAY'S TREATMENT TherEx:  NuStep lvl 4, 4' L PF, HS, RF stretch with strap x 30 sec Seated B Hip ABD Black TB 15x Hooklying isometric / ER with black band 3 x 5 reps Standing fitter hip extension x 10 each side  Seated fitter leg extension leg press x 15 reps each leg (1 blue / 1 black ) Side stepping with green TB 2 x 20 ft Monster with green Tb  Leg Press DL 69# 62X, L 52# 84X Bridge with ALT Knee Ext 8x each         PT Short Term Goals - 06/05/15 1346    PT SHORT TERM GOAL #1   Title independent with initial HEP by 06/15/15   Status Achieved           PT Long Term Goals - 06/05/15 1347    PT LONG TERM GOAL #1   Title pt able to return to water aerobics without  restiction by 07/13/15   Status On-going   PT LONG TERM GOAL #2   Title pt independent with advanced HEP as necessary for continued LE progression by 07/13/15   Status On-going   PT LONG TERM GOAL #3   Title pt ambulates with good mechanics over level and uneven terrain to include up/down stairs with recip gait and no more than single rail use by 07/13/15   Status On-going   PT LONG TERM GOAL #4   Title R LE MMT 4+/5 or better grossly without pain by 07/13/15   Status On-going               Plan - 06/26/15 1629    Clinical Impression Statement Pt. tolerated all LE strengthening well with only mild L knee pain with single leg leg press 10#.  Today's treatment focused on L quad strengthening with low intensity CKC activities; pt. tolerated all these well.  Pt. continues to progress toward all goals.     PT Next Visit Plan OKC LE strengthening with some low intensity CKC as tolerated.  Hip Stability and strengthening to tolerance with emphasis on hip  ABD.  Gait and balance PRN.   Consulted and Agree with Plan of Care Patient        Problem List There are no active problems to display for this patient.   Kermit BaloMicah Ramanda Paules, PTA 06/26/2015, 4:38 PM  [facility] [place] [address]  [address] [place], [place], [address] Phone: [phone]   Fax:  [phone]  Name: Carly Mitchell MRN: [id number] Date of Birth: 1950-10-30

## 2015-06-29 ENCOUNTER — Ambulatory Visit: Payer: 59

## 2015-06-29 DIAGNOSIS — M25562 Pain in left knee: Secondary | ICD-10-CM

## 2015-06-29 DIAGNOSIS — R262 Difficulty in walking, not elsewhere classified: Secondary | ICD-10-CM

## 2015-06-29 DIAGNOSIS — M25662 Stiffness of left knee, not elsewhere classified: Secondary | ICD-10-CM

## 2015-06-29 NOTE — Therapy (Signed)
[facility] [address]  [address] [place], [place], [address] Phone: [phone]   Fax:  [phone]  Physical Therapy Treatment  Patient Details  Name: Carly Mitchell MRN: [id number] Date of Birth: [birth year] Referring Provider: R. Valma Cava, MD  Encounter Date: 06/29/2015      PT End of Session - 06/29/15 1027    Visit Number 9   Number of Visits 12   Date for PT Re-Evaluation 07/13/15   PT Start Time 1021   PT Stop Time 1102   PT Time Calculation (min) 41 min   Activity Tolerance Patient tolerated treatment well   Behavior During Therapy [facility] for tasks assessed/performed      Past Medical History  Diagnosis Date  . Arthritis   . Hypertension     Past Surgical History  Procedure Laterality Date  . Knee arthroscopy w/ meniscectomy Left 05/17/15    There were no vitals filed for this visit.  Visit Diagnosis:  Difficulty walking  Left knee pain  Knee stiffness, left      Subjective Assessment - 06/29/15 1024    Subjective Pt. reports 0/10 L knee pain currently.  Pt reports MD is pleased with L knee progress.     Patient Stated Goals get back to walking without limp   Currently in Pain? No/denies   Pain Score 0-No pain   Multiple Pain Sites No       TODAY'S TREATMENT:  TherEx: Recumbent bike level 2, 4 min  L PF, HS, RF stretch with strap x 30 sec SL bridge x 10 each side Bridge with isometric / ER with 3 x 5 reps  B HS curl with bridging x 10 reps  SLS on blue foam pad with hip ext, flexion, abduction 2 x 5 reps each way each leg Standing TKE with black TB in door x 15 rep  Seated fitter leg extension leg press x 15 reps each leg (1 blue / 1 black ) Leg Press DL 13# 24M, L 01# x 10; pt. with mild L knee pain at end of set each set         PT Short Term Goals - 06/05/15 1346    PT SHORT TERM GOAL #1   Title independent with initial HEP by 06/15/15   Status Achieved           PT  Long Term Goals - 06/05/15 1347    PT LONG TERM GOAL #1   Title pt able to return to water aerobics without restiction by 07/13/15   Status On-going   PT LONG TERM GOAL #2   Title pt independent with advanced HEP as necessary for continued LE progression by 07/13/15   Status On-going   PT LONG TERM GOAL #3   Title pt ambulates with good mechanics over level and uneven terrain to include up/down stairs with recip gait and no more than single rail use by 07/13/15   Status On-going   PT LONG TERM GOAL #4   Title R LE MMT 4+/5 or better grossly without pain by 07/13/15   Status On-going               Plan - 06/29/15 1027    Clinical Impression Statement Pt. tolerated all OKC strengthening activity well however continues to only tolerated low intensity CKC L LE strengthening.  Pt. has a tendency to not report when activity causes her L knee pain; requiring frequent check in from therapist  regarding L knee comfort.  Pt. reports MD was pleased with pt. progress with PT.     PT Next Visit Plan OKC LE strengthening with some low intensity CKC as tolerated.  Hip Stability and strengthening to tolerance with emphasis on hip ABD.  Gait and balance PRN.        Problem List There are no active problems to display for this patient.   Kermit BaloMicah Seyed Heffley, PTA 06/29/2015, 12:37 PM  [facility] [place] [address]  [address] [place], [place], [address] Phone: [phone]   Fax:  [phone]  Name: Carly Mitchell MRN: [id number] Date of Birth: 05-02-1950

## 2015-07-03 ENCOUNTER — Ambulatory Visit: Payer: 59 | Admitting: Physical Therapy

## 2015-07-03 DIAGNOSIS — M25662 Stiffness of left knee, not elsewhere classified: Secondary | ICD-10-CM

## 2015-07-03 DIAGNOSIS — R262 Difficulty in walking, not elsewhere classified: Secondary | ICD-10-CM

## 2015-07-03 DIAGNOSIS — M25562 Pain in left knee: Secondary | ICD-10-CM

## 2015-07-03 NOTE — Therapy (Signed)
[facility] [address]  [place], [place], [address] Phone: [phone]   Fax:  [phone]  Physical Therapy Treatment  Patient Details  Name: Carly Mitchell MRN: [id number] Date of Birth: 11/14/1950 Referring Provider: R. Hart Robinsons, MD  Encounter Date: 07/03/2015      PT End of Session - 07/03/15 1406    Visit Number 10   Number of Visits 12   Date for PT Re-Evaluation 07/13/15   PT Start Time 8921   PT Stop Time 1450   PT Time Calculation (min) 46 min      Past Medical History  Diagnosis Date  . Arthritis   . Hypertension     Past Surgical History  Procedure Laterality Date  . Knee arthroscopy w/ meniscectomy Left 05/17/15    There were no vitals filed for this visit.  Visit Diagnosis:  Difficulty walking  Left knee pain  Knee stiffness, left      Subjective Assessment - 07/03/15 1411    Subjective States has been attending water aerobics 1-2x/wk for past few weeks and went for 1 hour walk yesterday.  All without limitation by knee pain. States is very pleased with knee stating "it just feels so good to not be in constant pain"  States R knee has even been feeling better lately.   Currently in Pain? Yes   Pain Score --  rates pain 1-2/1 on AVG when present   Pain Location Knee   Pain Orientation Left            [facility] PT Assessment - 07/03/15 0001    AROM   Overall AROM Comments L Knee 4-140   PROM   Overall PROM Comments L knee 0-140      TODAY'S TREATMENT:  TherEx: Rec bike lvl 2, 4 min Knee Flexion Machine 25# 15x Knee Extension Machine 20# 15x MiniTramp March 15x 6" FW Step-up 10x with Single Pole A (notes mild L knee pain) Standing CKC Hip ABD in/out of Trendelenburg 15x each with Single Pole A DL Squat at edge of plinth 10x (foam pad on plinth) DF Rocker Stretch 3x20" Stretch into Knee Flexion and Extension Standing TKE Black TB 15x (HEP instruct) Seated fitter  Leg Press 1 Black + 1 Blue 15x each.  Then performed with 2 Black but only able to perform 8 reps with L then stopped due to knee pain.           PT Short Term Goals - 06/05/15 1346    PT SHORT TERM GOAL #1   Title independent with initial HEP by 06/15/15   Status Achieved           PT Long Term Goals - 07/03/15 1455    PT LONG TERM GOAL #1   Title pt able to return to water aerobics without restiction   Status Achieved   PT LONG TERM GOAL #2   Title pt independent with advanced HEP as necessary for continued LE progression by 07/13/15   Status On-going   PT LONG TERM GOAL #3   Title pt ambulates with good mechanics over level and uneven terrain to include up/down stairs with recip gait and no more than single rail use by 07/13/15  need to assess stairs, limited ability with step-ups in clinic but pt states she is able to perform stairs recip gait   Status Partially Met   PT LONG TERM GOAL #4   Title R LE MMT 4+/5  or better grossly without pain by 07/13/15   Status On-going               Plan - 2015/07/25 1456    Clinical Impression Statement good progress with Knee PROM and AROM.  She is exericising more to include water aerobics and walking without restriction by knee pain.  Mild pain with CKC exercises persists and advise her to not push through this pain at this point.  She is very pleased with progress to date.  She has 2 visits remainin on POC and that should be enough to meet remaining goals.   PT Next Visit Plan OKC and CKC LE strengthening to tolerance; assess and train stairs; gait and balance PRN.   Consulted and Agree with Plan of Care Patient          G-Codes - 25-Jul-2015 1406    Functional Assessment Tool Used FOTO45% limitation   Functional Limitation Mobility: Walking and moving around   Mobility: Walking and Moving Around Current Status [phone]) At least 40 percent but less than 60 percent impaired, limited or restricted   Mobility: Walking and Moving  Around Goal Status [phone]) At least 20 percent but less than 40 percent impaired, limited or restricted      Problem List There are no active problems to display for this patient.   [facility] PT, OCS 07/25/15, 2:58 PM  [facility] [address]  [place], [place], [address] Phone: [phone]   Fax:  [phone]  Name: Carly Mitchell MRN: [id number] Date of Birth: 03/09/1951

## 2015-07-06 ENCOUNTER — Ambulatory Visit: Payer: 59

## 2015-07-09 ENCOUNTER — Ambulatory Visit: Payer: 59 | Attending: Specialist | Admitting: Physical Therapy

## 2015-07-09 DIAGNOSIS — M25562 Pain in left knee: Secondary | ICD-10-CM | POA: Diagnosis present

## 2015-07-09 DIAGNOSIS — M25662 Stiffness of left knee, not elsewhere classified: Secondary | ICD-10-CM | POA: Diagnosis not present

## 2015-07-09 DIAGNOSIS — R262 Difficulty in walking, not elsewhere classified: Secondary | ICD-10-CM | POA: Diagnosis present

## 2015-07-09 NOTE — Therapy (Signed)
[facility] [place] [address]  [address] [place], [place], [address] Phone: [phone]   Fax:  [phone]  Physical Therapy Treatment  Patient Details  Name: Carly Mitchell MRN: [id number] Date of Birth: [birth year] Referring Provider: R. Valma CavaAndrew Collins, MD  Encounter Date: 07/09/2015      PT End of Session - 07/09/15 1409    Visit Number 11   Number of Visits 12   Date for PT Re-Evaluation 07/13/15   PT Start Time 1408   PT Stop Time 1450   PT Time Calculation (min) 42 min      Past Medical History  Diagnosis Date  . Arthritis   . Hypertension     Past Surgical History  Procedure Laterality Date  . Knee arthroscopy w/ meniscectomy Left 05/17/15    There were no vitals filed for this visit.  Visit Diagnosis:  Difficulty walking  Left knee pain  Knee stiffness, left      Subjective Assessment - 07/09/15 1410    Subjective States hasn't performed exercises since last treatment due to going on vacation. Is more sore today rating 2/10.  States walked some while away to include walking on the beach.  States she was not limited by knee pain with her vaction, but states she was careful.   Currently in Pain? Yes   Pain Score 2    Pain Location Knee   Pain Orientation Left           TODAY'S TREATMENT:  TherEx: Bridge 15x Stretch B HS, SKTC, Piri Up/down 1 flight stairs - recip gait with light single rail use, notes mild medial L knee pain with ascending, less so with descending Knee Flexion Machine 25# 2x15 DF Rocker Stretch 2x20" Standing TKE Black TB 15x3" Resisted Gait FW and BW 20' each with sport cord PBall (55cm) wall squat 8x (difficult but without increased knee pain) Hip ABD, Ext, and Flexion with Green TB and contra LE SLS without AD 12x each          PT Education - 07/09/15 1451    Education provided Yes   Education Details TKE   Person(s) Educated Patient   Methods  Explanation;Demonstration;Handout   Comprehension Verbalized understanding;Returned demonstration          PT Short Term Goals - 06/05/15 1346    PT SHORT TERM GOAL #1   Title independent with initial HEP by 06/15/15   Status Achieved           PT Long Term Goals - 07/09/15 1453    PT LONG TERM GOAL #3   Title pt ambulates with good mechanics over level and uneven terrain to include up/down stairs with recip gait and no more than single rail use by 07/13/15   Status Achieved               Plan - 07/09/15 1452    Clinical Impression Statement one more visit on POC and will likely be ready for d/c next visit.  function is quite good only with  mild intermittent pain at this point. Stairs performed well today.   PT Next Visit Plan assess for d/c vs continue   Consulted and Agree with Plan of Care Patient        Problem List There are no active problems to display for this patient.   Kal Chait PT, OCS 07/09/2015, 2:53 PM  [facility] [place] [address]  [id number]  [place], [place], [address] Phone: [phone]   Fax:  [phone]  Name: Carly Mitchell MRN: [id number] Date of Birth: 04-May-1950

## 2015-07-17 ENCOUNTER — Ambulatory Visit: Payer: 59 | Admitting: Physical Therapy

## 2015-07-17 DIAGNOSIS — M25562 Pain in left knee: Secondary | ICD-10-CM

## 2015-07-17 DIAGNOSIS — R262 Difficulty in walking, not elsewhere classified: Secondary | ICD-10-CM

## 2015-07-17 NOTE — Therapy (Addendum)
[facility] [address]  [place], [place], [address] Phone: [phone]   Fax:  [phone]  Physical Therapy Treatment  Patient Details  Name: Carly Mitchell MRN: [id number] Date of Birth: [birth year] Referring Provider: R. Hart Robinsons, MD  Encounter Date: 07/17/2015      PT End of Session - 07/17/15 1019    Visit Number 12   Number of Visits 12   PT Start Time 1540   PT Stop Time 1052   PT Time Calculation (min) 34 min      Past Medical History  Diagnosis Date  . Arthritis   . Hypertension     Past Surgical History  Procedure Laterality Date  . Knee arthroscopy w/ meniscectomy Left 05/17/15    There were no vitals filed for this visit.      Subjective Assessment - 07/17/15 1020    Subjective states knee pain seems to hover around 1/10 lately.   Currently in Pain? Yes   Pain Score 1    Pain Location Knee   Pain Orientation Left            [facility] PT Assessment - 07/17/15 0001    Squat   Comments is able to perform to parallel with VC for proper mechanics (still tends to FW wt shift)   AROM   Overall AROM Comments L knee 1 or 2 - 140   Strength   Strength Assessment Site Hip;Knee   Right/Left Hip Left   Left Hip Flexion 5/5   Left Hip External Rotation 5/5   Left Hip Internal Rotation 5/5   Left Hip ABduction 4+/5  no pain   Left Hip ADduction 5/5   Right/Left Knee Left   Left Knee Flexion 5/5   Left Knee Extension 4+/5  mild pain      TODAY'S TREATMENT:  TherEx: Bridge 15x Stretch B HS, SKTC, Piri Edge of plinth squats Standing L Hip ABD Red TB at ankles Red TB 15x each Bridge with Black TB at knees 15x3"          PT Education - 07/17/15 1152    Education provided Yes   Education Details d/c HEP   Person(s) Educated Patient   Methods Explanation;Demonstration;Handout   Comprehension Returned demonstration;Verbalized understanding          PT Short Term Goals  - 06/05/15 1346    PT SHORT TERM GOAL #1   Title independent with initial HEP by 06/15/15   Status Achieved           PT Long Term Goals - 07/17/15 1022    PT LONG TERM GOAL #1   Title pt able to return to water aerobics without restiction   Status Achieved   PT LONG TERM GOAL #2   Title pt independent with advanced HEP as necessary for continued LE progression by 07/13/15   Status Achieved   PT LONG TERM GOAL #3   Title pt ambulates with good mechanics over level and uneven terrain to include up/down stairs with recip gait and no more than single rail use by 07/13/15   Status Achieved   PT LONG TERM GOAL #4   Title R LE MMT 4+/5 or better grossly without pain by 07/13/15  is 4+/5 to 5/5 but noted mild anterior knee pain with knee extension MMT   Status Partially Met               Plan - 07/17/15 1152  Clinical Impression Statement Good progress and all goals met other than still with mild pain during L knee extension MMT (but is up to 4+/5). She is pleased with progress and is independent with HEP at this time; however, she is concerned that she may relapse without PT intervention.  Due to this, we are placing her on hold while she performs HEP and continues with pool exercises.  Should her pain increase or should she have issues that she feels require PT intervention, I have advised her to contact us.  We will keep her chart on hold for approx 30 days after which we will discharge if she hasn't returned to PT.   PT Next Visit Plan on hold until 08/16/15   Consulted and Agree with Plan of Care Patient      Visit Diagnosis: Difficulty in walking, not elsewhere classified       G-Codes - 07-19-15 1117    Functional Assessment Tool Used FOTO 37% limitation   Functional Limitation Mobility: Walking and moving around   Mobility: Walking and Moving Around Current Status [phone]) At least 20 percent but less than 40 percent impaired, limited or restricted   Mobility: Walking and  Moving Around Goal Status [phone]) At least 20 percent but less than 40 percent impaired, limited or restricted   Mobility: Walking and Moving Around Discharge Status [phone]) At least 20 percent but less than 40 percent impaired, limited or restricted      Problem List There are no active problems to display for this patient.   Shilo Pauwels PT, OCS 19-Jul-2015, 11:57 AM  [facility] [address]  [place], [place], [address] Phone: [phone]   Fax:  [phone]  Name: Carly Mitchell MRN: [id number] Date of Birth: 1951/01/19    PHYSICAL THERAPY DISCHARGE SUMMARY  Visits from Start of Care: 12  Current functional level related to goals / functional outcomes: Excellent progress with OPPT and all goals basically met at last appointment on 07-19-15. She was reluctant to completely discharge at that time due to fearing her pain would return, so she was placed on hold from PT and advised to call us if her pain returned and we'd extend her POC as necessary.  We haven't heard from her and we are therefore discharging her from our care at this time.   Remaining deficits: Intermittent L knee pain, mild weakness L knee   Education / Equipment: HEP Plan: Patient agrees to discharge.  Patient goals were partially met. Patient is being discharged due to meeting the stated rehab goals.  ?????        Leonette Most PT, OCS 08/29/2015 9:31 AM

## 2015-10-01 ENCOUNTER — Other Ambulatory Visit: Payer: Self-pay | Admitting: Obstetrics & Gynecology

## 2015-10-01 DIAGNOSIS — Z1231 Encounter for screening mammogram for malignant neoplasm of breast: Secondary | ICD-10-CM

## 2015-10-22 ENCOUNTER — Ambulatory Visit
Admission: RE | Admit: 2015-10-22 | Discharge: 2015-10-22 | Disposition: A | Discharge status: Home / Self Care | Payer: Medicare Other | Source: Ambulatory Visit | Attending: Obstetrics & Gynecology | Admitting: Obstetrics & Gynecology

## 2015-10-22 DIAGNOSIS — Z1231 Encounter for screening mammogram for malignant neoplasm of breast: Secondary | ICD-10-CM

## 2016-06-27 ENCOUNTER — Ambulatory Visit: Payer: Self-pay | Admitting: Orthopedic Surgery

## 2016-08-11 NOTE — H&P (Signed)
TOTAL KNEE ADMISSION H&P  Patient is being admitted for left total knee arthroplasty.  Subjective:  Chief Complaint:left knee pain.  HPI: Carly Mitchell, 66 y.o. female, has a history of pain and functional disability in the left knee due to arthritis and has failed non-surgical conservative treatments for greater than 12 weeks to includecorticosteriod injections, viscosupplementation injections, flexibility and strengthening excercises, use of assistive devices and weight reduction as appropriate.  Onset of symptoms was gradual, starting 2 years ago with gradually worsening course since that time. The patient noted prior procedures on the knee to include  arthroscopy on the left knee(s).  Patient currently rates pain in the left knee(s) at 7 out of 10 with activity. Patient has worsening of pain with activity and weight bearing, pain that interferes with activities of daily living, pain with passive range of motion and joint swelling.  Patient has evidence of subchondral sclerosis, periarticular osteophytes and joint space narrowing by imaging studies. There is no active infection.  There are no active problems to display for this patient.  Past Medical History:  Diagnosis Date  . Arthritis   . Hypertension     Past Surgical History:  Procedure Laterality Date  . KNEE ARTHROSCOPY W/ MENISCECTOMY Left 05/17/15    No prescriptions prior to admission.   Allergies  Allergen Reactions  . Codeine Nausea Only  . Erythromycin Nausea Only    Social History  Substance Use Topics  . Smoking status: Former Smoker    Quit date: 04/07/1980  . Smokeless tobacco: Not on file  . Alcohol use Not on file    No family history on file.   Review of Systems  Constitutional: Negative.   HENT: Negative.   Eyes: Negative.   Respiratory: Negative.   Cardiovascular: Negative.   Gastrointestinal: Negative.   Genitourinary: Negative.   Musculoskeletal: Positive for joint pain.  Skin: Negative.    Neurological: Negative.   Endo/Heme/Allergies: Negative.   Psychiatric/Behavioral: Negative.     Objective:  Physical Exam  Constitutional: She is oriented to person, place, and time. She appears well-developed.  HENT:  Head: Normocephalic.  Eyes: EOM are normal.  Neck: Normal range of motion.  Cardiovascular: Normal rate and intact distal pulses.   Respiratory: Effort normal.  GI: Soft.  Genitourinary:  Genitourinary Comments: deferred  Musculoskeletal:  Pain with rom. Good stability of knee. LLE grossly n/v intact.  Neurological: She is oriented to person, place, and time.  Skin: Skin is warm and dry.  Psychiatric: Her behavior is normal.    Vital signs in last 24 hours: BP: ()/()  Arterial Line BP: ()/()   Labs:   There is no height or weight on file to calculate BMI.   Imaging Review Plain radiographs demonstrate moderate degenerative joint disease of the left knee(s). The overall alignment ismild varus. The bone quality appears to be good for age and reported activity level.  Assessment/Plan:  End stage arthritis, left knee   The patient history, physical examination, clinical judgment of the provider and imaging studies are consistent with end stage degenerative joint disease of the left knee(s) and total knee arthroplasty is deemed medically necessary. The treatment options including medical management, injection therapy arthroscopy and arthroplasty were discussed at length. The risks and benefits of total knee arthroplasty were presented and reviewed. The risks due to aseptic loosening, infection, stiffness, patella tracking problems, thromboembolic complications and other imponderables were discussed. The patient acknowledged the explanation, agreed to proceed with the plan and consent was signed.  Patient is being admitted for inpatient treatment for surgery, pain control, PT, OT, prophylactic antibiotics, VTE prophylaxis, progressive ambulation and ADL's and  discharge planning. The patient is planning to be discharged home with home health services.  Will use IV tranexamic acid. Contraindications and adverse affects of Tranexamic acid discussed in detail. Patient denies any of these at this time and understands the risks and benefits.

## 2016-08-14 ENCOUNTER — Other Ambulatory Visit (HOSPITAL_COMMUNITY): Payer: Self-pay | Admitting: Emergency Medicine

## 2016-08-14 NOTE — Progress Notes (Signed)
lov/ CLEARANCE KALISH 07-29-16 ON CHART EKG 07-31-16 CHART

## 2016-08-14 NOTE — Patient Instructions (Signed)
Carly KhatJanet C Mitchell  08/14/2016   Your procedure is scheduled on: 08-22-16  [facility]  Entrance [place]  elevators to 3rd floor to  Short Stay Center at [phone].   Call this number if you have problems the morning of surgery [phone]    Remember: ONLY 1 PERSON MAY GO WITH YOU TO SHORT STAY TO GET  READY MORNING OF YOUR SURGERY.  Do not eat food or drink liquids :After Midnight.     Take these medicines the morning of surgery with A SIP OF WATER: amlodipine(Norvasc), omeprazole(Prilosec), tolterodine(detrol)                                 You may not have any metal on your body including hair pins and              piercings  Do not wear jewelry, make-up, lotions, powders or perfumes, deodorant             Do not wear nail polish.  Do not shave  48 hours prior to surgery.             Do not bring valuables to the hospital.  IS NOT             RESPONSIBLE   FOR VALUABLES.  Contacts, dentures or bridgework may not be worn into surgery.  Leave suitcase in the car. After surgery it may be brought to your room.                Please read over the following fact sheets you were given: _____________________________________________________________________             [facility] - Preparing for [facility] Before surgery, you can play an important role.  Because skin is not sterile, your skin needs to be as free of germs as possible.  You can reduce the number of germs on your skin by washing with CHG (chlorahexidine gluconate) soap before surgery.  CHG is an antiseptic cleaner which kills germs and bonds with the skin to continue killing germs even after washing. Please DO NOT use if you have an allergy to CHG or antibacterial soaps.  If your skin becomes reddened/irritated stop using the CHG and inform your nurse when you arrive at Short Stay. Do not shave (including legs and underarms) for at least 48 hours prior to the first CHG shower.  You  may shave your face/neck. Please follow these instructions carefully:  1.  Shower with CHG Soap the night before surgery and the  morning of Surgery.  2.  If you choose to wash your hair, wash your hair first as usual with your  normal  shampoo.  3.  After you shampoo, rinse your hair and body thoroughly to remove the  shampoo.                           4.  Use CHG as you would any other liquid soap.  You can apply chg directly  to the skin and wash                       Gently with a scrungie or clean washcloth.  5.  Apply the CHG Soap to your body ONLY FROM THE NECK  DOWN.   Do not use on face/ open                           Wound or open sores. Avoid contact with eyes, ears mouth and genitals (private parts).                       Wash face,  Genitals (private parts) with your normal soap.             6.  Wash thoroughly, paying special attention to the area where your surgery  will be performed.  7.  Thoroughly rinse your body with warm water from the neck down.  8.  DO NOT shower/wash with your normal soap after using and rinsing off  the CHG Soap.                9.  Pat yourself dry with a clean towel.            10.  Wear clean pajamas.            11.  Place clean sheets on your bed the night of your first shower and do not  sleep with pets. Day of Surgery : Do not apply any lotions/deodorants the morning of surgery.  Please wear clean clothes to the hospital/surgery center.  FAILURE TO FOLLOW THESE INSTRUCTIONS MAY RESULT IN THE CANCELLATION OF YOUR SURGERY PATIENT SIGNATURE_________________________________  NURSE SIGNATURE__________________________________  ________________________________________________________________________   Carly Mitchell  An incentive spirometer is a tool that can help keep your lungs clear and active. This tool measures how well you are filling your lungs with each breath. Taking long deep breaths may help reverse or decrease the chance of  developing breathing (pulmonary) problems (especially infection) following:  A long period of time when you are unable to move or be active. BEFORE THE PROCEDURE   If the spirometer includes an indicator to show your best effort, your nurse or respiratory therapist will set it to a desired goal.  If possible, sit up straight or lean slightly forward. Try not to slouch.  Hold the incentive spirometer in an upright position. INSTRUCTIONS FOR USE  1. Sit on the edge of your bed if possible, or sit up as far as you can in bed or on a chair. 2. Hold the incentive spirometer in an upright position. 3. Breathe out normally. 4. Place the mouthpiece in your mouth and seal your lips tightly around it. 5. Breathe in slowly and as deeply as possible, raising the piston or the ball toward the top of the column. 6. Hold your breath for 3-5 seconds or for as long as possible. Allow the piston or ball to fall to the bottom of the column. 7. Remove the mouthpiece from your mouth and breathe out normally. 8. Rest for a few seconds and repeat Steps 1 through 7 at least 10 times every 1-2 hours when you are awake. Take your time and take a few normal breaths between deep breaths. 9. The spirometer may include an indicator to show your best effort. Use the indicator as a goal to work toward during each repetition. 10. After each set of 10 deep breaths, practice coughing to be sure your lungs are clear. If you have an incision (the cut made at the time of surgery), support your incision when coughing by placing a pillow or rolled up towels firmly against it. Once you are able to  get out of bed, walk around indoors and cough well. You may stop using the incentive spirometer when instructed by your caregiver.  RISKS AND COMPLICATIONS  Take your time so you do not get dizzy or light-headed.  If you are in pain, you may need to take or ask for pain medication before doing incentive spirometry. It is harder to take a  deep breath if you are having pain. AFTER USE  Rest and breathe slowly and easily.  It can be helpful to keep track of a log of your progress. Your caregiver can provide you with a simple table to help with this. If you are using the spirometer at home, follow these instructions: SEEK MEDICAL CARE IF:   You are having difficultly using the spirometer.  You have trouble using the spirometer as often as instructed.  Your pain medication is not giving enough relief while using the spirometer.  You develop fever of 100.5 F (38.1 C) or higher. SEEK IMMEDIATE MEDICAL CARE IF:   You cough up bloody sputum that had not been present before.  You develop fever of 102 F (38.9 C) or greater.  You develop worsening pain at or near the incision site. MAKE SURE YOU:   Understand these instructions.  Will watch your condition.  Will get help right away if you are not doing well or get worse. Document Released: 08/04/2006 Document Revised: 06/16/2011 Document Reviewed: 10/05/2006 [facility] Patient Information 2014 [place], [place].   ________________________________________________________________________

## 2016-08-15 ENCOUNTER — Encounter (INDEPENDENT_AMBULATORY_CARE_PROVIDER_SITE_OTHER): Payer: Self-pay

## 2016-08-15 ENCOUNTER — Encounter (HOSPITAL_COMMUNITY): Payer: Self-pay

## 2016-08-15 ENCOUNTER — Encounter (HOSPITAL_COMMUNITY)
Admission: RE | Admit: 2016-08-15 | Discharge: 2016-08-15 | Disposition: A | Discharge status: Home / Self Care | Payer: Medicare Other | Source: Ambulatory Visit | Attending: Specialist | Admitting: Specialist

## 2016-08-15 DIAGNOSIS — M1712 Unilateral primary osteoarthritis, left knee: Secondary | ICD-10-CM | POA: Insufficient documentation

## 2016-08-15 DIAGNOSIS — Z01812 Encounter for preprocedural laboratory examination: Secondary | ICD-10-CM | POA: Insufficient documentation

## 2016-08-15 HISTORY — DX: Frequency of micturition: R35.0

## 2016-08-15 LAB — CBC
HCT: 42.4 % (ref 36.0–46.0)
HEMOGLOBIN: 14.4 g/dL (ref 12.0–15.0)
MCH: 31.1 pg (ref 26.0–34.0)
MCHC: 34 g/dL (ref 30.0–36.0)
MCV: 91.6 fL (ref 78.0–100.0)
Platelets: 257 10*3/uL (ref 150–400)
RBC: 4.63 MIL/uL (ref 3.87–5.11)
RDW: 13 % (ref 11.5–15.5)
WBC: 8 10*3/uL (ref 4.0–10.5)

## 2016-08-15 LAB — BASIC METABOLIC PANEL
ANION GAP: 6 (ref 5–15)
BUN: 18 mg/dL (ref 6–20)
CHLORIDE: 110 mmol/L (ref 101–111)
CO2: 24 mmol/L (ref 22–32)
Calcium: 9.2 mg/dL (ref 8.9–10.3)
Creatinine, Ser: 0.83 mg/dL (ref 0.44–1.00)
GFR calc non Af Amer: >60 mL/min (ref >60)
GLUCOSE: 103 mg/dL — AB (ref 65–99)
Potassium: 4.1 mmol/L (ref 3.5–5.1)
Sodium: 140 mmol/L (ref 135–145)

## 2016-08-15 LAB — URINALYSIS, ROUTINE W REFLEX MICROSCOPIC
Bilirubin Urine: NEGATIVE
GLUCOSE, UA: NEGATIVE mg/dL
HGB URINE DIPSTICK: NEGATIVE
KETONES UR: NEGATIVE mg/dL
LEUKOCYTES UA: NEGATIVE
Nitrite: NEGATIVE
PH: 5 (ref 5.0–8.0)
PROTEIN: NEGATIVE mg/dL
Specific Gravity, Urine: 1.006 (ref 1.005–1.030)

## 2016-08-15 LAB — PROTIME-INR
INR: 1
Prothrombin Time: 13.2 seconds (ref 11.4–15.2)

## 2016-08-15 LAB — APTT: aPTT: 27 seconds (ref 24–36)

## 2016-08-15 LAB — SURGICAL PCR SCREEN
MRSA, PCR: NEGATIVE
Staphylococcus aureus: NEGATIVE

## 2016-08-15 LAB — ABO/RH: ABO/RH(D): A POS

## 2016-08-22 ENCOUNTER — Inpatient Hospital Stay (HOSPITAL_COMMUNITY)
Admission: RE | Admit: 2016-08-22 | Discharge: 2016-08-24 | DRG: 470 | Disposition: A | Discharge status: Home Health | Payer: Medicare Other | Source: Ambulatory Visit | Attending: Specialist | Admitting: Specialist

## 2016-08-22 ENCOUNTER — Encounter (HOSPITAL_COMMUNITY): Payer: Self-pay

## 2016-08-22 ENCOUNTER — Encounter (HOSPITAL_COMMUNITY)
Admission: RE | Disposition: A | Discharge status: Home Health | Payer: Self-pay | Source: Ambulatory Visit | Attending: Specialist

## 2016-08-22 ENCOUNTER — Inpatient Hospital Stay (HOSPITAL_COMMUNITY): Payer: Medicare Other | Admitting: Anesthesiology

## 2016-08-22 DIAGNOSIS — E875 Hyperkalemia: Secondary | ICD-10-CM | POA: Diagnosis present

## 2016-08-22 DIAGNOSIS — Z885 Allergy status to narcotic agent status: Secondary | ICD-10-CM | POA: Diagnosis not present

## 2016-08-22 DIAGNOSIS — Z881 Allergy status to other antibiotic agents status: Secondary | ICD-10-CM

## 2016-08-22 DIAGNOSIS — Z79899 Other long term (current) drug therapy: Secondary | ICD-10-CM | POA: Diagnosis not present

## 2016-08-22 DIAGNOSIS — Z96651 Presence of right artificial knee joint: Secondary | ICD-10-CM

## 2016-08-22 DIAGNOSIS — D72829 Elevated white blood cell count, unspecified: Secondary | ICD-10-CM | POA: Diagnosis present

## 2016-08-22 DIAGNOSIS — Z7982 Long term (current) use of aspirin: Secondary | ICD-10-CM | POA: Diagnosis not present

## 2016-08-22 DIAGNOSIS — I10 Essential (primary) hypertension: Secondary | ICD-10-CM | POA: Diagnosis present

## 2016-08-22 DIAGNOSIS — K219 Gastro-esophageal reflux disease without esophagitis: Secondary | ICD-10-CM | POA: Diagnosis present

## 2016-08-22 DIAGNOSIS — Z87891 Personal history of nicotine dependence: Secondary | ICD-10-CM | POA: Diagnosis not present

## 2016-08-22 DIAGNOSIS — E876 Hypokalemia: Secondary | ICD-10-CM | POA: Diagnosis present

## 2016-08-22 DIAGNOSIS — Z96659 Presence of unspecified artificial knee joint: Secondary | ICD-10-CM

## 2016-08-22 DIAGNOSIS — M1712 Unilateral primary osteoarthritis, left knee: Secondary | ICD-10-CM

## 2016-08-22 HISTORY — PX: TOTAL KNEE ARTHROPLASTY: SHX125

## 2016-08-22 LAB — TYPE AND SCREEN
ABO/RH(D): A POS
ANTIBODY SCREEN: NEGATIVE

## 2016-08-22 SURGERY — ARTHROPLASTY, KNEE, TOTAL
Anesthesia: Spinal | Site: Knee | Laterality: Left

## 2016-08-22 MED ORDER — STERILE WATER FOR IRRIGATION IR SOLN
Status: DC | PRN
  Administered 2016-08-22: 2000 mL

## 2016-08-22 MED ORDER — METHOCARBAMOL 1000 MG/10ML IJ SOLN
500.0000 mg | Freq: Four times a day (QID) | INTRAVENOUS | Status: DC | PRN
  Filled 2016-08-22: qty 5

## 2016-08-22 MED ORDER — FENTANYL CITRATE (PF) 100 MCG/2ML IJ SOLN
INTRAMUSCULAR | Status: DC | PRN
  Administered 2016-08-22 (×2): 50 ug via INTRAVENOUS

## 2016-08-22 MED ORDER — CEFAZOLIN SODIUM-DEXTROSE 2-4 GM/100ML-% IV SOLN
2.0000 g | Freq: Four times a day (QID) | INTRAVENOUS | Status: AC
  Administered 2016-08-22 (×2): 2 g via INTRAVENOUS
  Filled 2016-08-22 (×2): qty 100

## 2016-08-22 MED ORDER — MIDAZOLAM HCL 2 MG/2ML IJ SOLN
2.0000 mg | Freq: Once | INTRAMUSCULAR | Status: AC
  Administered 2016-08-22: 2 mg via INTRAVENOUS

## 2016-08-22 MED ORDER — DEXAMETHASONE SODIUM PHOSPHATE 10 MG/ML IJ SOLN
10.0000 mg | Freq: Once | INTRAMUSCULAR | Status: AC
  Administered 2016-08-22: 10 mg via INTRAVENOUS

## 2016-08-22 MED ORDER — PROMETHAZINE HCL 12.5 MG PO TABS: 12.5000 mg | ORAL_TABLET | Freq: Four times a day (QID) | ORAL | 0 refills | Status: AC | PRN

## 2016-08-22 MED ORDER — PROPOFOL 10 MG/ML IV BOLUS
INTRAVENOUS | Status: AC
  Filled 2016-08-22: qty 60

## 2016-08-22 MED ORDER — OXYCODONE HCL 5 MG PO TABS: 5.0000 mg | ORAL_TABLET | ORAL | 0 refills | Status: AC | PRN

## 2016-08-22 MED ORDER — BISACODYL 5 MG PO TBEC: 5.0000 mg | DELAYED_RELEASE_TABLET | Freq: Every day | ORAL | Status: DC | PRN

## 2016-08-22 MED ORDER — ENOXAPARIN SODIUM 30 MG/0.3ML ~~LOC~~ SOLN
30.0000 mg | Freq: Two times a day (BID) | SUBCUTANEOUS | Status: DC
  Administered 2016-08-23 – 2016-08-24 (×3): 30 mg via SUBCUTANEOUS
  Filled 2016-08-22 (×3): qty 0.3

## 2016-08-22 MED ORDER — ONDANSETRON HCL 4 MG PO TABS
4.0000 mg | ORAL_TABLET | Freq: Four times a day (QID) | ORAL | Status: DC | PRN
  Administered 2016-08-23 – 2016-08-24 (×2): 4 mg via ORAL
  Filled 2016-08-22 (×2): qty 1

## 2016-08-22 MED ORDER — FENTANYL CITRATE (PF) 100 MCG/2ML IJ SOLN
INTRAMUSCULAR | Status: AC
  Filled 2016-08-22: qty 2

## 2016-08-22 MED ORDER — BUPIVACAINE IN DEXTROSE 0.75-8.25 % IT SOLN
INTRATHECAL | Status: DC | PRN
  Administered 2016-08-22: 2 mL via INTRATHECAL

## 2016-08-22 MED ORDER — METHOCARBAMOL 500 MG PO TABS
500.0000 mg | ORAL_TABLET | Freq: Four times a day (QID) | ORAL | Status: DC | PRN
  Administered 2016-08-22 – 2016-08-24 (×3): 500 mg via ORAL
  Filled 2016-08-22 (×4): qty 1

## 2016-08-22 MED ORDER — DEXAMETHASONE SODIUM PHOSPHATE 10 MG/ML IJ SOLN
10.0000 mg | Freq: Once | INTRAMUSCULAR | Status: AC
  Administered 2016-08-23: 10 mg via INTRAVENOUS
  Filled 2016-08-22: qty 1

## 2016-08-22 MED ORDER — SODIUM CHLORIDE 0.9 % IR SOLN
Status: DC | PRN
  Administered 2016-08-22: 1000 mL

## 2016-08-22 MED ORDER — PHENOL 1.4 % MT LIQD: 1.0000 | OROMUCOSAL | Status: DC | PRN

## 2016-08-22 MED ORDER — PROPOFOL 10 MG/ML IV BOLUS
INTRAVENOUS | Status: AC
  Filled 2016-08-22: qty 20

## 2016-08-22 MED ORDER — ONDANSETRON HCL 4 MG/2ML IJ SOLN: 4.0000 mg | Freq: Once | INTRAMUSCULAR | Status: DC | PRN

## 2016-08-22 MED ORDER — ONDANSETRON HCL 4 MG/2ML IJ SOLN
INTRAMUSCULAR | Status: AC
  Filled 2016-08-22: qty 2

## 2016-08-22 MED ORDER — MIDAZOLAM HCL 5 MG/5ML IJ SOLN
INTRAMUSCULAR | Status: DC | PRN
  Administered 2016-08-22: 1 mg via INTRAVENOUS

## 2016-08-22 MED ORDER — PHENYLEPHRINE HCL 10 MG/ML IJ SOLN
INTRAVENOUS | Status: DC | PRN
  Administered 2016-08-22: 35 ug/min via INTRAVENOUS

## 2016-08-22 MED ORDER — ZOLPIDEM TARTRATE 5 MG PO TABS
5.0000 mg | ORAL_TABLET | Freq: Every evening | ORAL | Status: DC | PRN
  Administered 2016-08-23: 5 mg via ORAL
  Filled 2016-08-22: qty 1

## 2016-08-22 MED ORDER — METHOCARBAMOL 500 MG PO TABS: 500.0000 mg | ORAL_TABLET | Freq: Three times a day (TID) | ORAL | 2 refills | Status: AC | PRN

## 2016-08-22 MED ORDER — BUPIVACAINE-EPINEPHRINE (PF) 0.5% -1:200000 IJ SOLN
INTRAMUSCULAR | Status: DC | PRN
  Administered 2016-08-22: 30 mL via PERINEURAL

## 2016-08-22 MED ORDER — KETOROLAC TROMETHAMINE 30 MG/ML IJ SOLN
INTRAMUSCULAR | Status: AC
  Filled 2016-08-22: qty 1

## 2016-08-22 MED ORDER — DOCUSATE SODIUM 100 MG PO CAPS
100.0000 mg | ORAL_CAPSULE | Freq: Two times a day (BID) | ORAL | Status: DC
  Administered 2016-08-22 – 2016-08-24 (×4): 100 mg via ORAL
  Filled 2016-08-22 (×4): qty 1

## 2016-08-22 MED ORDER — ALUM & MAG HYDROXIDE-SIMETH 200-200-20 MG/5ML PO SUSP: 30.0000 mL | ORAL | Status: DC | PRN

## 2016-08-22 MED ORDER — MAGNESIUM CITRATE PO SOLN: 1.0000 | Freq: Once | ORAL | Status: DC | PRN

## 2016-08-22 MED ORDER — AMLODIPINE BESYLATE 5 MG PO TABS
5.0000 mg | ORAL_TABLET | Freq: Every day | ORAL | Status: DC
  Administered 2016-08-23 – 2016-08-24 (×2): 5 mg via ORAL
  Filled 2016-08-22 (×2): qty 1

## 2016-08-22 MED ORDER — SODIUM CHLORIDE 0.9 % IV SOLN
INTRAVENOUS | Status: DC
  Administered 2016-08-22: 16:00:00 via INTRAVENOUS

## 2016-08-22 MED ORDER — HYDROMORPHONE HCL 1 MG/ML IJ SOLN: 1.0000 mg | INTRAMUSCULAR | Status: DC | PRN

## 2016-08-22 MED ORDER — FENTANYL CITRATE (PF) 100 MCG/2ML IJ SOLN: 25.0000 ug | INTRAMUSCULAR | Status: DC | PRN

## 2016-08-22 MED ORDER — FESOTERODINE FUMARATE ER 4 MG PO TB24
4.0000 mg | ORAL_TABLET | Freq: Every day | ORAL | Status: DC
  Administered 2016-08-24: 4 mg via ORAL
  Filled 2016-08-22 (×2): qty 1

## 2016-08-22 MED ORDER — CEFAZOLIN SODIUM-DEXTROSE 2-4 GM/100ML-% IV SOLN
INTRAVENOUS | Status: AC
  Filled 2016-08-22: qty 100

## 2016-08-22 MED ORDER — BUPIVACAINE HCL (PF) 0.25 % IJ SOLN
INTRAMUSCULAR | Status: DC | PRN
  Administered 2016-08-22: 30 mL

## 2016-08-22 MED ORDER — PRAVASTATIN SODIUM 40 MG PO TABS
40.0000 mg | ORAL_TABLET | Freq: Every day | ORAL | Status: DC
  Administered 2016-08-23: 40 mg via ORAL
  Filled 2016-08-22: qty 2
  Filled 2016-08-22: qty 1
  Filled 2016-08-22: qty 2
  Filled 2016-08-22: qty 1

## 2016-08-22 MED ORDER — FENTANYL CITRATE (PF) 100 MCG/2ML IJ SOLN
100.0000 ug | Freq: Once | INTRAMUSCULAR | Status: AC
  Administered 2016-08-22: 50 ug via INTRAVENOUS

## 2016-08-22 MED ORDER — CEFAZOLIN SODIUM-DEXTROSE 2-4 GM/100ML-% IV SOLN
2.0000 g | INTRAVENOUS | Status: AC
  Administered 2016-08-22: 2 g via INTRAVENOUS

## 2016-08-22 MED ORDER — FAMOTIDINE 20 MG PO TABS
20.0000 mg | ORAL_TABLET | Freq: Every day | ORAL | Status: DC
  Administered 2016-08-22 – 2016-08-23 (×2): 20 mg via ORAL
  Filled 2016-08-22 (×2): qty 1

## 2016-08-22 MED ORDER — FERROUS SULFATE 325 (65 FE) MG PO TABS
325.0000 mg | ORAL_TABLET | Freq: Three times a day (TID) | ORAL | Status: DC
  Administered 2016-08-23: 08:00:00 325 mg via ORAL
  Filled 2016-08-22 (×2): qty 1

## 2016-08-22 MED ORDER — ACETAMINOPHEN 325 MG PO TABS: 650.0000 mg | ORAL_TABLET | Freq: Four times a day (QID) | ORAL | Status: DC | PRN

## 2016-08-22 MED ORDER — ONDANSETRON HCL 4 MG/2ML IJ SOLN
4.0000 mg | Freq: Four times a day (QID) | INTRAMUSCULAR | Status: DC | PRN
  Administered 2016-08-22 – 2016-08-24 (×5): 4 mg via INTRAVENOUS
  Filled 2016-08-22 (×6): qty 2

## 2016-08-22 MED ORDER — ASPIRIN EC 325 MG PO TBEC: 325.0000 mg | DELAYED_RELEASE_TABLET | Freq: Two times a day (BID) | ORAL | 3 refills | Status: AC

## 2016-08-22 MED ORDER — MIDAZOLAM HCL 2 MG/2ML IJ SOLN
INTRAMUSCULAR | Status: AC
  Administered 2016-08-22: 2 mg via INTRAVENOUS
  Filled 2016-08-22: qty 2

## 2016-08-22 MED ORDER — TRANEXAMIC ACID 1000 MG/10ML IV SOLN
1000.0000 mg | INTRAVENOUS | Status: AC
  Administered 2016-08-22: 1000 mg via INTRAVENOUS
  Filled 2016-08-22: qty 1100

## 2016-08-22 MED ORDER — SODIUM CHLORIDE 0.9 % IJ SOLN
INTRAMUSCULAR | Status: AC
  Filled 2016-08-22: qty 50

## 2016-08-22 MED ORDER — KETOROLAC TROMETHAMINE 30 MG/ML IJ SOLN
INTRAMUSCULAR | Status: DC | PRN
  Administered 2016-08-22: 30 mg

## 2016-08-22 MED ORDER — PROPOFOL 500 MG/50ML IV EMUL
INTRAVENOUS | Status: DC | PRN
  Administered 2016-08-22: 125 ug/kg/min via INTRAVENOUS

## 2016-08-22 MED ORDER — METOCLOPRAMIDE HCL 5 MG/ML IJ SOLN
5.0000 mg | Freq: Three times a day (TID) | INTRAMUSCULAR | Status: DC | PRN
  Administered 2016-08-22: 21:00:00 5 mg via INTRAVENOUS
  Administered 2016-08-23: 05:00:00 10 mg via INTRAVENOUS
  Filled 2016-08-22 (×2): qty 2

## 2016-08-22 MED ORDER — CHLORHEXIDINE GLUCONATE 4 % EX LIQD: 60.0000 mL | Freq: Once | CUTANEOUS | Status: DC

## 2016-08-22 MED ORDER — BUPIVACAINE HCL (PF) 0.25 % IJ SOLN
INTRAMUSCULAR | Status: AC
  Filled 2016-08-22: qty 30

## 2016-08-22 MED ORDER — ONDANSETRON HCL 4 MG/2ML IJ SOLN
INTRAMUSCULAR | Status: DC | PRN
  Administered 2016-08-22: 4 mg via INTRAVENOUS

## 2016-08-22 MED ORDER — DIPHENHYDRAMINE HCL 12.5 MG/5ML PO ELIX: 12.5000 mg | ORAL_SOLUTION | ORAL | Status: DC | PRN

## 2016-08-22 MED ORDER — COENZYME Q10 30 MG PO CAPS: 30.0000 mg | ORAL_CAPSULE | Freq: Every day | ORAL | Status: DC

## 2016-08-22 MED ORDER — MIDAZOLAM HCL 2 MG/2ML IJ SOLN
INTRAMUSCULAR | Status: AC
  Filled 2016-08-22: qty 2

## 2016-08-22 MED ORDER — POLYETHYLENE GLYCOL 3350 17 G PO PACK: 17.0000 g | PACK | Freq: Every day | ORAL | Status: DC | PRN

## 2016-08-22 MED ORDER — ACETAMINOPHEN 650 MG RE SUPP: 650.0000 mg | Freq: Four times a day (QID) | RECTAL | Status: DC | PRN

## 2016-08-22 MED ORDER — OXYCODONE HCL 5 MG PO TABS
5.0000 mg | ORAL_TABLET | ORAL | Status: DC | PRN
  Administered 2016-08-22 – 2016-08-23 (×3): 10 mg via ORAL
  Administered 2016-08-23: 05:00:00 5 mg via ORAL
  Filled 2016-08-22 (×4): qty 2

## 2016-08-22 MED ORDER — SODIUM CHLORIDE 0.9 % IJ SOLN
INTRAMUSCULAR | Status: DC | PRN
  Administered 2016-08-22: 29 mL

## 2016-08-22 MED ORDER — IRBESARTAN 300 MG PO TABS
300.0000 mg | ORAL_TABLET | Freq: Every day | ORAL | Status: DC
  Administered 2016-08-23 – 2016-08-24 (×2): 300 mg via ORAL
  Filled 2016-08-22 (×2): qty 2
  Filled 2016-08-22 (×2): qty 1

## 2016-08-22 MED ORDER — FENTANYL CITRATE (PF) 100 MCG/2ML IJ SOLN
INTRAMUSCULAR | Status: AC
  Administered 2016-08-22: 50 ug via INTRAVENOUS
  Filled 2016-08-22: qty 2

## 2016-08-22 MED ORDER — LACTATED RINGERS IV SOLN
INTRAVENOUS | Status: DC | PRN
  Administered 2016-08-22 (×3): via INTRAVENOUS

## 2016-08-22 MED ORDER — DEXAMETHASONE SODIUM PHOSPHATE 10 MG/ML IJ SOLN
INTRAMUSCULAR | Status: AC
  Filled 2016-08-22: qty 1

## 2016-08-22 MED ORDER — MENTHOL 3 MG MT LOZG: 1.0000 | LOZENGE | OROMUCOSAL | Status: DC | PRN

## 2016-08-22 MED ORDER — METOCLOPRAMIDE HCL 5 MG PO TABS: 5.0000 mg | ORAL_TABLET | Freq: Three times a day (TID) | ORAL | Status: DC | PRN

## 2016-08-22 SURGICAL SUPPLY — 59 items
ADH SKN CLS APL DERMABOND .7 (GAUZE/BANDAGES/DRESSINGS) ×1
BANDAGE ACE 4X5 VEL STRL LF (GAUZE/BANDAGES/DRESSINGS) ×3 IMPLANT
BANDAGE ACE 6X5 VEL STRL LF (GAUZE/BANDAGES/DRESSINGS) ×3 IMPLANT
BLADE SAG 18X100X1.27 (BLADE) ×3 IMPLANT
BLADE SAW SGTL 13.0X1.19X90.0M (BLADE) ×3 IMPLANT
BOWL SMART MIX CTS (DISPOSABLE) ×3 IMPLANT
CAP KNEE TOTAL 3 SIGMA ×2 IMPLANT
CEMENT HV SMART SET (Cement) ×4 IMPLANT
COVER SURGICAL LIGHT HANDLE (MISCELLANEOUS) ×3 IMPLANT
CUFF TOURN SGL QUICK 34 (TOURNIQUET CUFF) ×3
CUFF TRNQT CYL 34X4X40X1 (TOURNIQUET CUFF) ×1 IMPLANT
DERMABOND ADVANCED (GAUZE/BANDAGES/DRESSINGS) ×2
DERMABOND ADVANCED .7 DNX12 (GAUZE/BANDAGES/DRESSINGS) ×1 IMPLANT
DRAPE U-SHAPE 47X51 STRL (DRAPES) ×3 IMPLANT
DRESSING AQUACEL AG SP 3.5X10 (GAUZE/BANDAGES/DRESSINGS) IMPLANT
DRSG AQUACEL AG SP 3.5X10 (GAUZE/BANDAGES/DRESSINGS) ×3
DRSG TEGADERM 4X4.75 (GAUZE/BANDAGES/DRESSINGS) ×3 IMPLANT
DURAPREP 26ML APPLICATOR (WOUND CARE) ×6 IMPLANT
ELECT REM PT RETURN 15FT ADLT (MISCELLANEOUS) ×3 IMPLANT
EVACUATOR 1/8 PVC DRAIN (DRAIN) ×3 IMPLANT
GAUZE SPONGE 2X2 8PLY STRL LF (GAUZE/BANDAGES/DRESSINGS) ×1 IMPLANT
GLOVE BIO SURGEON STRL SZ 6.5 (GLOVE) ×1 IMPLANT
GLOVE BIO SURGEONS STRL SZ 6.5 (GLOVE) ×1
GLOVE BIOGEL PI IND STRL 6.5 (GLOVE) IMPLANT
GLOVE BIOGEL PI IND STRL 7.5 (GLOVE) IMPLANT
GLOVE BIOGEL PI IND STRL 8 (GLOVE) ×2 IMPLANT
GLOVE BIOGEL PI INDICATOR 6.5 (GLOVE) ×2
GLOVE BIOGEL PI INDICATOR 7.5 (GLOVE) ×6
GLOVE BIOGEL PI INDICATOR 8 (GLOVE) ×6
GLOVE ECLIPSE 8.0 STRL XLNG CF (GLOVE) ×8 IMPLANT
GLOVE SURG ORTHO 9.0 STRL STRW (GLOVE) ×3 IMPLANT
GLOVE SURG SS PI 7.5 STRL IVOR (GLOVE) ×5 IMPLANT
GOWN STRL REUS W/ TWL XL LVL3 (GOWN DISPOSABLE) IMPLANT
GOWN STRL REUS W/TWL LRG LVL3 (GOWN DISPOSABLE) ×3 IMPLANT
GOWN STRL REUS W/TWL XL LVL3 (GOWN DISPOSABLE) ×9 IMPLANT
HANDPIECE INTERPULSE COAX TIP (DISPOSABLE) ×3
IMMOBILIZER KNEE 20 (SOFTGOODS) ×3
IMMOBILIZER KNEE 20 THIGH 36 (SOFTGOODS) ×1 IMPLANT
PACK TOTAL KNEE CUSTOM (KITS) ×3 IMPLANT
POSITIONER SURGICAL ARM (MISCELLANEOUS) ×3 IMPLANT
SET HNDPC FAN SPRY TIP SCT (DISPOSABLE) ×1 IMPLANT
SET PAD KNEE POSITIONER (MISCELLANEOUS) ×3 IMPLANT
SPONGE GAUZE 2X2 STER 10/PKG (GAUZE/BANDAGES/DRESSINGS) ×2
SPONGE SURGIFOAM ABS GEL 100 (HEMOSTASIS) ×3 IMPLANT
STOCKINETTE 6  STRL (DRAPES) ×2
STOCKINETTE 6 STRL (DRAPES) ×1 IMPLANT
SUCTION FRAZIER HANDLE 12FR (TUBING) ×2
SUCTION TUBE FRAZIER 12FR DISP (TUBING) ×1 IMPLANT
SUT BONE WAX W31G (SUTURE) ×2 IMPLANT
SUT MNCRL AB 3-0 PS2 18 (SUTURE) ×3 IMPLANT
SUT VIC AB 1 CT1 27 (SUTURE) ×12
SUT VIC AB 1 CT1 27XBRD ANTBC (SUTURE) ×4 IMPLANT
SUT VIC AB 2-0 CT1 27 (SUTURE) ×6
SUT VIC AB 2-0 CT1 TAPERPNT 27 (SUTURE) ×2 IMPLANT
SUT VLOC 180 0 24IN GS25 (SUTURE) ×3 IMPLANT
TAPE STRIPS DRAPE STRL (GAUZE/BANDAGES/DRESSINGS) ×3 IMPLANT
TRAY FOLEY CATH SILVER 14FR (SET/KITS/TRAYS/PACK) ×3 IMPLANT
WRAP KNEE MAXI GEL POST OP (GAUZE/BANDAGES/DRESSINGS) ×3 IMPLANT
YANKAUER SUCT BULB TIP 10FT TU (MISCELLANEOUS) ×3 IMPLANT

## 2016-08-22 NOTE — Interval H&P Note (Signed)
History and Physical Interval Note:  08/22/2016 12:03 PM  Carly Mitchell  has presented today for surgery, with the diagnosis of Left knee osteoarthritis  The various methods of treatment have been discussed with the patient and family. After consideration of risks, benefits and other options for treatment, the patient has consented to  Procedure(s): LEFT TOTAL KNEE ARTHROPLASTY (Left) as a surgical intervention .  The patient's history has been reviewed, patient examined, no change in status, stable for surgery.  I have reviewed the patient's chart and labs.  Questions were answered to the patient's satisfaction.     Shawon Denzer ANDREW

## 2016-08-22 NOTE — Transfer of Care (Signed)
Immediate Anesthesia Transfer of Care Note  Patient: Celestia KhatJanet C Lashley  Procedure(s) Performed: Procedure(s) with comments: LEFT TOTAL KNEE ARTHROPLASTY (Left) - Adductor Block  Patient Location: PACU  Anesthesia Type:Spinal  Level of Consciousness: awake, alert , oriented and patient cooperative  Airway & Oxygen Therapy: Patient Spontanous Breathing and Patient connected to face mask oxygen  Post-op Assessment: Report given to RN and Post -op Vital signs reviewed and stable  Post vital signs: stable  Last Vitals:  Vitals:   08/22/16 1152 08/22/16 1153  BP: (!) 144/80   Pulse: 86 88  Resp: (!) 9 (!) 7  Temp:      Last Pain:  Vitals:   08/22/16 0943  TempSrc: Oral      Patients Stated Pain Goal: 4 (08/22/16 1005)  Complications: No apparent anesthesia complications  Spinal level T12

## 2016-08-22 NOTE — Anesthesia Procedure Notes (Signed)
Anesthesia Regional Block: Adductor canal block   Pre-Anesthetic Checklist: ,, timeout performed, Correct Patient, Correct Site, Correct Laterality, Correct Procedure, Correct Position, site marked, Risks and benefits discussed,  Surgical consent,  Pre-op evaluation,  At surgeon's request and post-op pain management  Laterality: Left  Prep: chloraprep       Needles:  Injection technique: Single-shot  Needle Type: Echogenic Needle     Needle Length: 9cm  Needle Gauge: 21     Additional Needles:   Procedures: ultrasound guided,,,,,,,,  Narrative:  Start time: 08/22/2016 11:40 AM End time: 08/22/2016 11:45 AM Injection made incrementally with aspirations every 5 mL.  Performed by: Personally  Anesthesiologist: Cecile HearingURK, STEPHEN EDWARD  Additional Notes: No pain on injection. No increased resistance to injection. Injection made in 5cc increments.  Good needle visualization.  Patient tolerated procedure well.

## 2016-08-22 NOTE — Anesthesia Postprocedure Evaluation (Signed)
Anesthesia Post Note  Patient: Carly Mitchell  Procedure(s) Performed: Procedure(s) (LRB): LEFT TOTAL KNEE ARTHROPLASTY (Left)  Patient location during evaluation: PACU Anesthesia Type: Spinal Level of consciousness: oriented and awake and alert Pain management: pain level controlled Vital Signs Assessment: post-procedure vital signs reviewed and stable Respiratory status: spontaneous breathing, respiratory function stable and patient connected to nasal cannula oxygen Cardiovascular status: blood pressure returned to baseline and stable Postop Assessment: no headache and no backache Anesthetic complications: no       Last Vitals:  Vitals:   08/22/16 1625 08/22/16 1725  BP: (!) 149/79 (!) 155/85  Pulse: 77 77  Resp: 15 16  Temp: 36.4 C 36.4 C    Last Pain:  Vitals:   08/22/16 1725  TempSrc: Axillary  PainSc:                  Carly Mitchell

## 2016-08-22 NOTE — Anesthesia Procedure Notes (Signed)
Spinal  Patient location during procedure: OR End time: 08/22/2016 12:13 PM Staffing Resident/CRNA: Enrigue Catena E Performed: resident/CRNA  Preanesthetic Checklist Completed: patient identified, site marked, surgical consent, pre-op evaluation, timeout performed, IV checked, risks and benefits discussed and monitors and equipment checked Spinal Block Patient position: sitting Prep: DuraPrep Patient monitoring: heart rate, continuous pulse ox and blood pressure Approach: midline Location: L3-4 Injection technique: single-shot Needle Needle type: Pencan  Needle gauge: 24 G Needle length: 9 cm Assessment Sensory level: T6 Additional Notes Expiration date of kit checked and confirmed. Patient tolerated procedure well, without complications.

## 2016-08-22 NOTE — Op Note (Signed)
DATE OF SURGERY:  08/22/2016  TIME: 1:45 PM  PATIENT NAME:  Carly Mitchell    AGE: 66 y.o.   PRE-OPERATIVE DIAGNOSIS:  Left knee osteoarthritis  POST-OPERATIVE DIAGNOSIS:  Left knee osteoarthritis  PROCEDURE:  Procedure(s): LEFT TOTAL KNEE ARTHROPLASTY  SURGEON:  Mattalyn Anderegg ANDREW  ASSISTANT:  Bryson Stilwell, PA-C, present and scrubbed throughout the case, critical for assistance with exposure, retraction, instrumentation, and closure.  OPERATIVE IMPLANTS: Depuy PFC Sigma Rotating Platform.  Femur size 3, Tibia size 2.5, Patella size 35 3-peg oval button, with a 10 mm polyethylene insert.   PREOPERATIVE INDICATIONS:   Carly Mitchell is a 66 y.o. year old female with end stage bone on bone arthritis of the knee who failed conservative treatment and elected for Total Knee Arthroplasty.   The risks, benefits, and alternatives were discussed at length including but not limited to the risks of infection, bleeding, nerve injury, stiffness, blood clots, the need for revision surgery, cardiopulmonary complications, among others, and they were willing to proceed.  OPERATIVE DESCRIPTION:  The patient was brought to the operative room and placed in a supine position.  Spinal anesthesia was administered.  IV antibiotics were given.  The lower extremity was prepped and draped in the usual sterile fashion.  Time out was performed.  The leg was elevated and exsanguinated and the tourniquet was inflated.  Anterior quadriceps tendon splitting approach was performed.  The patella was retracted and osteophytes were removed.  The anterior horn of the medial and lateral meniscus was removed and cruciate ligaments resected.   The distal femur was opened with the drill and the intramedullary distal femoral cutting jig was utilized, set at 5 degrees resecting 10 mm off the distal femur.  Care was taken to protect the collateral ligaments.  The distal femoral sizing jig was applied, taking care to  avoid notching.  Then the 4-in-1 cutting jig was applied and the anterior and posterior femur was cut, along with the chamfer cuts.    Then the extramedullary tibial cutting jig was utilized making the appropriate cut using the anterior tibial crest as a reference building in appropriate posterior slope.  Care was taken during the cut to protect the medial and collateral ligaments.  The proximal tibia was removed along with the posterior horns of the menisci.   The posterior medial femoral osteophytes and posterior lateral femoral osteophytes were removed.    The flexion gap was then measured and was symmetric with the extension gap, measured at 10.  I completed the distal femoral preparation using the appropriate jig to prepare the box.  The patella was then measured, and cut with the saw.    The proximal tibia sized and prepared accordingly with the reamer and the punch, and then all components were trialed with the trial insert.  The knee was found to have excellent balance and full motion.    The above named components were then cemented into place and all excess cement was removed.  The trial polyethylene component was in place during cementation, and then was exchanged for the real polyethylene component.    The knee was easily taken through a range of motion and the patella tracked well and the knee irrigated copiously and the parapatellar and subcutaneous tissue closed with vicryl, and monocryl with steri strips for the skin.  The arthrotomy was closed at 90 of flexion. The wounds were dressed with sterile gauze and the tourniquet released and the patient was awakened and returned to the PACU  in stable and satisfactory condition.  There were no complications.  Total tourniquet time was 80 minutes.

## 2016-08-22 NOTE — Anesthesia Preprocedure Evaluation (Addendum)
Anesthesia Evaluation  Patient identified by MRN, date of birth, ID band Patient awake    Reviewed: Allergy & Precautions, NPO status , Patient's Chart, lab work & pertinent test results  Airway Mallampati: II  TM Distance: <3 FB Neck ROM: Full    Dental  (+) Teeth Intact, Dental Advisory Given, Implants   Pulmonary former smoker,    Pulmonary exam normal breath sounds clear to auscultation       Cardiovascular hypertension, Pt. on medications Normal cardiovascular exam Rhythm:Regular Rate:Normal     Neuro/Psych negative neurological ROS  negative psych ROS   GI/Hepatic Neg liver ROS, GERD  Medicated and Controlled,  Endo/Other  Obesity   Renal/GU negative Renal ROS     Musculoskeletal  (+) Arthritis , Osteoarthritis,    Abdominal   Peds  Hematology negative hematology ROS (+) Plt 257k   Anesthesia Other Findings Day of surgery medications reviewed with the patient.  Reproductive/Obstetrics                            Anesthesia Physical Anesthesia Plan  ASA: II  Anesthesia Plan: Spinal   Post-op Pain Management:  Regional for Post-op pain   Induction: Intravenous  Airway Management Planned: Simple Face Mask  Additional Equipment:   Intra-op Plan:   Post-operative Plan:   Informed Consent: I have reviewed the patients History and Physical, chart, labs and discussed the procedure including the risks, benefits and alternatives for the proposed anesthesia with the patient or authorized representative who has indicated his/her understanding and acceptance.   Dental advisory given  Plan Discussed with: CRNA, Anesthesiologist and Surgeon  Anesthesia Plan Comments: (Discussed risks and benefits of and differences between spinal and general. Discussed risks of spinal including headache, backache, failure, bleeding, infection, and nerve damage. Patient consents to spinal. Questions  answered. Coagulation studies and platelet count acceptable.)        Anesthesia Quick Evaluation

## 2016-08-22 NOTE — Progress Notes (Signed)
AssistedDr. Turk with left, ultrasound guided, adductor canal block. Side rails up, monitors on throughout procedure. See vital signs in flow sheet. Tolerated Procedure well.  

## 2016-08-23 DIAGNOSIS — E876 Hypokalemia: Secondary | ICD-10-CM

## 2016-08-23 LAB — COMPREHENSIVE METABOLIC PANEL
ALT: 36 U/L (ref 14–54)
AST: 31 U/L (ref 15–41)
Albumin: 3.6 g/dL (ref 3.5–5.0)
Alkaline Phosphatase: 57 U/L (ref 38–126)
Anion gap: 9 (ref 5–15)
BILIRUBIN TOTAL: 0.5 mg/dL (ref 0.3–1.2)
BUN: 12 mg/dL (ref 6–20)
CALCIUM: 8.4 mg/dL — AB (ref 8.9–10.3)
CO2: 23 mmol/L (ref 22–32)
CREATININE: 0.65 mg/dL (ref 0.44–1.00)
Chloride: 101 mmol/L (ref 101–111)
Glucose, Bld: 193 mg/dL — ABNORMAL HIGH (ref 65–99)
Potassium: 3.5 mmol/L (ref 3.5–5.1)
Sodium: 133 mmol/L — ABNORMAL LOW (ref 135–145)
TOTAL PROTEIN: 6.7 g/dL (ref 6.5–8.1)

## 2016-08-23 LAB — BASIC METABOLIC PANEL
ANION GAP: 6 (ref 5–15)
BUN: 10 mg/dL (ref 6–20)
CALCIUM: 5.8 mg/dL — AB (ref 8.9–10.3)
CO2: 19 mmol/L — AB (ref 22–32)
Chloride: 114 mmol/L — ABNORMAL HIGH (ref 101–111)
Creatinine, Ser: 0.36 mg/dL — ABNORMAL LOW (ref 0.44–1.00)
GFR calc Af Amer: >60 mL/min (ref >60)
GLUCOSE: 114 mg/dL — AB (ref 65–99)
POTASSIUM: 2.5 mmol/L — AB (ref 3.5–5.1)
Sodium: 139 mmol/L (ref 135–145)

## 2016-08-23 LAB — CBC
HEMATOCRIT: 32.8 % — AB (ref 36.0–46.0)
Hemoglobin: 11.2 g/dL — ABNORMAL LOW (ref 12.0–15.0)
MCH: 30.8 pg (ref 26.0–34.0)
MCHC: 34.1 g/dL (ref 30.0–36.0)
MCV: 90.1 fL (ref 78.0–100.0)
PLATELETS: 193 10*3/uL (ref 150–400)
RBC: 3.64 MIL/uL — ABNORMAL LOW (ref 3.87–5.11)
RDW: 12.5 % (ref 11.5–15.5)
WBC: 13.4 10*3/uL — ABNORMAL HIGH (ref 4.0–10.5)

## 2016-08-23 LAB — MAGNESIUM: Magnesium: 1.7 mg/dL (ref 1.7–2.4)

## 2016-08-23 MED ORDER — POTASSIUM CHLORIDE IN NACL 40-0.9 MEQ/L-% IV SOLN
INTRAVENOUS | Status: DC
  Administered 2016-08-23 (×2): 75 mL/h via INTRAVENOUS
  Filled 2016-08-23 (×3): qty 1000

## 2016-08-23 MED ORDER — TRAMADOL HCL 50 MG PO TABS
50.0000 mg | ORAL_TABLET | Freq: Four times a day (QID) | ORAL | Status: DC | PRN
Start: 2016-08-23 — End: 2016-08-24
  Administered 2016-08-23 – 2016-08-24 (×4): 100 mg via ORAL
  Filled 2016-08-23 (×5): qty 2

## 2016-08-23 MED ORDER — ACETAMINOPHEN 500 MG PO TABS
1000.0000 mg | ORAL_TABLET | Freq: Three times a day (TID) | ORAL | Status: DC
  Administered 2016-08-23 – 2016-08-24 (×4): 1000 mg via ORAL
  Filled 2016-08-23 (×4): qty 2

## 2016-08-23 NOTE — Evaluation (Signed)
Physical Therapy Evaluation Patient Details Name: Carly Mitchell MRN: [id number] DOB: 1950/08/19 Today's Date: 08/23/2016   History of Present Illness  s/p L TKA  Clinical Impression  Pt is s/p TKA resulting in the deficits listed below (see PT Problem List).  Pt will benefit from skilled PT to increase their independence and safety with mobility to allow discharge to the venue listed below.  Pt doing well, reports pain meds are making her sick so she is trying to only take acetaminophen    Follow Up Recommendations Home health PT    Equipment Recommendations  Rolling walker with 5" wheels    Recommendations for Other Services       Precautions / Restrictions Precautions Precautions: Fall;Knee Required Braces or Orthoses: Knee Immobilizer - Left Knee Immobilizer - Left: Discontinue once straight leg raise with < 10 degree lag Restrictions Other Position/Activity Restrictions: WBAT      Mobility  Bed Mobility               General bed mobility comments: NT-pt in chair  Transfers Overall transfer level: Needs assistance Equipment used: Rolling walker (2 wheeled) Transfers: Sit to/from Stand Sit to Stand: Min assist;Min guard         General transfer comment: cues for hand placement and LLE position; light assist from chair and toilet  Ambulation/Gait Ambulation/Gait assistance: Min guard Ambulation Distance (Feet): 60 Feet Assistive device: Rolling walker (2 wheeled) Gait Pattern/deviations: Step-to pattern;Antalgic     General Gait Details: cues for sequence and safety with Rw during turns  Stairs            Wheelchair Mobility    Modified Rankin (Stroke Patients Only)       Balance                                             Pertinent Vitals/Pain Pain Assessment: 0-10 Pain Location: L knee Pain Descriptors / Indicators: Aching;Sore Pain Intervention(s): Limited activity within patient's tolerance;Premedicated before  session;Monitored during session;Repositioned;Ice applied    Home Living Family/patient expects to be discharged to:: Private residence Living Arrangements: Alone Available Help at Discharge: Family;Friend(s) Type of Home: House Home Access: Stairs to enter Entrance Stairs-Rails: Right Entrance Stairs-Number of Steps: 5 Home Layout: Two level;Full bath on main level;Able to live on main level with bedroom/bathroom Home Equipment: Bedside commode Additional Comments: borrowed 3in1    Prior Function Level of Independence: Independent               Hand Dominance        Extremity/Trunk Assessment   Upper Extremity Assessment Upper Extremity Assessment: Overall WFL for tasks assessed;Defer to OT evaluation    Lower Extremity Assessment Lower Extremity Assessment: LLE deficits/detail LLE Deficits / Details: ankle WFL; knee limited by post op pain and weakness; knee flexion AAROM grossly 8* to 55*       Communication   Communication: No difficulties  Cognition Arousal/Alertness: Awake/alert Behavior During Therapy: WFL for tasks assessed/performed Overall Cognitive Status: Within Functional Limits for tasks assessed                                        General Comments      Exercises Total Joint Exercises Ankle Circles/Pumps: AROM;Both;10 reps Quad Sets: 10 reps;Both;AROM  Assessment/Plan    PT Assessment Patient needs continued PT services  PT Problem List Decreased strength;Decreased range of motion;Decreased activity tolerance;Decreased mobility;Decreased knowledge of use of DME       PT Treatment Interventions DME instruction;Gait training;Functional mobility training;Therapeutic activities;Therapeutic exercise;Stair training;Patient/family education    PT Goals (Current goals can be found in the Care Plan section)  Acute Rehab PT Goals Patient Stated Goal: less knee pain PT Goal Formulation: With patient Time For Goal Achievement:  08/27/16 Potential to Achieve Goals: Good    Frequency 7X/week   Barriers to discharge        Co-evaluation               AM-PAC PT "6 Clicks" Daily Activity  Outcome Measure Difficulty turning over in bed (including adjusting bedclothes, sheets and blankets)?: A Little Difficulty moving from lying on back to sitting on the side of the bed? : A Little Difficulty sitting down on and standing up from a chair with arms (e.g., wheelchair, bedside commode, etc,.)?: A Little Help needed moving to and from a bed to chair (including a wheelchair)?: A Little Help needed walking in hospital room?: A Little Help needed climbing 3-5 steps with a railing? : A Little 6 Click Score: 18    End of Session Equipment Utilized During Treatment: Gait belt Activity Tolerance: Patient tolerated treatment well Patient left: in chair;with call bell/phone within reach;with family/visitor present Nurse Communication: Mobility status PT Visit Diagnosis: Difficulty in walking, not elsewhere classified (R26.2);Pain Pain - Right/Left: Left Pain - part of body: Knee    Time: 1201-1226 PT Time Calculation (min) (ACUTE ONLY): 25 min   Charges:   PT Evaluation $PT Eval Low Complexity: 1 Procedure PT Treatments $Gait Training: 8-22 mins   PT G CodesDrucilla Chalet, PT Pager: [phone] 08/23/2016   Drucilla Chalet 08/23/2016, 1:01 PM

## 2016-08-23 NOTE — Evaluation (Signed)
Occupational Therapy Evaluation and Discharge Patient Details Name: Carly Mitchell MRN: [id number] DOB: Jul 17, 1950 Today's Date: 08/23/2016    History of Present Illness s/p L TKA   Clinical Impression   Pt was independent prior to admission. Presents with post operative pain and impaired standing balance. She requires min assist for ADL. All education completed. Pt will have assistance of her family upon discharge. No further OT needs.    Follow Up Recommendations  No OT follow up    Equipment Recommendations  None recommended by OT    Recommendations for Other Services       Precautions / Restrictions Precautions Precautions: Fall;Knee Required Braces or Orthoses: Knee Immobilizer - Left Knee Immobilizer - Left: Discontinue once straight leg raise with < 10 degree lag Restrictions Weight Bearing Restrictions: No Other Position/Activity Restrictions: WBAT      Mobility Bed Mobility Overal bed mobility: Modified Independent               Transfers Overall transfer level: Needs assistance Equipment used: Rolling walker (2 wheeled) Transfers: Sit to/from Stand Sit to Stand: Min guard         General transfer comment: cues for hand placement    Balance                                           ADL either performed or assessed with clinical judgement   ADL Overall ADL's : Needs assistance/impaired Eating/Feeding: Independent;Sitting   Grooming: Wash/dry hands;Standing;Supervision/safety   Upper Body Bathing: Set up;Sitting   Lower Body Bathing: Minimal assistance;Sit to/from stand Lower Body Bathing Details (indicate cue type and reason): recommended long handle bath sponge Upper Body Dressing : Set up;Sitting   Lower Body Dressing: Minimal assistance;Sit to/from stand Lower Body Dressing Details (indicate cue type and reason): instructed in use of reacher, elastic laces, safe footwear Toilet Transfer: Min  guard;RW;Ambulation;Comfort height toilet;Grab bars   Toileting- Clothing Manipulation and Hygiene: Supervision/safety;Sit to/from Nurse, children'sstand     Tub/Shower Transfer Details (indicate cue type and reason): instructed in method of tub transfer avoiding stepping over edge of tub Functional mobility during ADLs: Min guard;Rolling walker       Vision Baseline Vision/History: Wears glasses Wears Glasses: At all times Patient Visual Report: No change from baseline       Perception     Praxis      Pertinent Vitals/Pain Pain Assessment: Faces Faces Pain Scale: Hurts even more Pain Location: L knee Pain Descriptors / Indicators: Aching;Sore Pain Intervention(s): Patient requesting pain meds-RN notified;Ice applied     Hand Dominance Right   Extremity/Trunk Assessment Upper Extremity Assessment Upper Extremity Assessment: Overall WFL for tasks assessed   Lower Extremity Assessment Lower Extremity Assessment: Defer to PT evaluation LLE Deficits / Details: ankle WFL; knee limited by post op pain and weakness; knee flexion AAROM grossly 8* to 55*       Communication Communication Communication: No difficulties   Cognition Arousal/Alertness: Awake/alert Behavior During Therapy: WFL for tasks assessed/performed Overall Cognitive Status: Within Functional Limits for tasks assessed                                     General Comments       Exercises Total Joint Exercises Ankle Circles/Pumps: AROM;Both;10 reps Quad Sets: 10 reps;Both;AROM   Shoulder  Instructions      Home Living Family/patient expects to be discharged to:: Private residence Living Arrangements: Alone Available Help at Discharge: Family;Friend(s) Type of Home: House Home Access: Stairs to enter [employer] of Steps: 5 Entrance Stairs-Rails: Right Home Layout: Two level;Full bath on main level;Able to live on main level with bedroom/bathroom     Bathroom Shower/Tub: Tub/shower  unit;Walk-in shower (tub shower on first floor)   Bathroom Toilet: Handicapped height     Home Equipment: Bedside commode;Shower seat;Adaptive equipment Adaptive Equipment: Reacher Additional Comments: borrowed 3in1      Prior Functioning/Environment Level of Independence: Independent                 OT Problem List: Decreased strength;Decreased activity tolerance;Impaired balance (sitting and/or standing);Decreased knowledge of use of DME or AE;Pain      OT Treatment/Interventions:      OT Goals(Current goals can be found in the care plan section) Acute Rehab OT Goals Patient Stated Goal: less knee pain  OT Frequency:     Barriers to D/C:            Co-evaluation              AM-PAC PT "6 Clicks" Daily Activity     Outcome Measure Help from another person eating meals?: None Help from another person taking care of personal grooming?: A Little Help from another person toileting, which includes using toliet, bedpan, or urinal?: A Little Help from another person bathing (including washing, rinsing, drying)?: A Little Help from another person to put on and taking off regular upper body clothing?: None Help from another person to put on and taking off regular lower body clothing?: A Little 6 Click Score: 20   End of Session Equipment Utilized During Treatment: Gait belt;Rolling walker Nurse Communication: Patient requests pain meds  Activity Tolerance: Patient tolerated treatment well Patient left: in bed;with call bell/phone within reach  OT Visit Diagnosis: Unsteadiness on feet (R26.81);Pain Pain - Right/Left: Left Pain - part of body: Knee                Time: 1610-9604 OT Time Calculation (min): 37 min Charges:  OT General Charges $OT Visit: 1 Procedure OT Evaluation $OT Eval Low Complexity: 1 Procedure OT Treatments $Self Care/Home Management : 8-22 mins G-Codes:     Evern Bio 08/23/2016, 2:46 PM  [phone]

## 2016-08-23 NOTE — Progress Notes (Signed)
Subjective: 1 Day Post-Op Procedure(s) (LRB): LEFT TOTAL KNEE ARTHROPLASTY (Left) Patient reports pain as 3 on 0-10 scale. Calcium and Potassium levels are very low. I spoke with the Hospitalist and he will evaluate. She is receiving IV K now. Will repeat these studies. She is stable. Hemovac DCd,    Objective: Vital signs in last 24 hours: Temp:  [97.4 F (36.3 C)-98.1 F (36.7 C)] 97.7 F (36.5 C) (05/19 0553) Pulse Rate:  [70-92] 92 (05/19 0553) Resp:  [0-26] 15 (05/19 0553) BP: (120-170)/(66-97) 151/93 (05/19 0553) SpO2:  [95 %-100 %] 97 % (05/19 0553) Weight:  [87.1 kg (192 lb)] 87.1 kg (192 lb) (05/18 1005)  Intake/Output from previous day: 05/18 0701 - 05/19 0700 In: 4263.8 [P.O.:840; I.V.:3423.8] Out: 1790 [Urine:1275; Drains:415; Blood:100] Intake/Output this shift: No intake/output data recorded.   Recent Labs  08/23/16 0458  HGB 11.2*    Recent Labs  08/23/16 0458  WBC 13.4*  RBC 3.64*  HCT 32.8*  PLT 193    Recent Labs  08/23/16 0458  NA 139  K 2.5*  CL 114*  CO2 19*  BUN 10  CREATININE 0.36*  GLUCOSE 114*  CALCIUM 5.8*   No results for input(s): LABPT, INR in the last 72 hours.  Dorsiflexion/Plantar flexion intact No cellulitis present Compartment soft  Assessment/Plan: 1 Day Post-Op Procedure(s) (LRB): LEFT TOTAL KNEE ARTHROPLASTY (Left) Up with therapy.Hospitalist will evaluate.  Raistlin Gum A 08/23/2016, 9:02 AM

## 2016-08-23 NOTE — Progress Notes (Signed)
Physical Therapy Treatment Patient Details Name: Carly Mitchell MRN: [id number] DOB: February 17, 1951 Today's Date: 08/23/2016    History of Present Illness s/p L TKA    PT Comments    Pt progressing well, hopeful to D/C on Sunday   Follow Up Recommendations  Home health PT     Equipment Recommendations  Rolling walker with 5" wheels    Recommendations for Other Services       Precautions / Restrictions Precautions Precautions: Fall;Knee Required Braces or Orthoses: Knee Immobilizer - Left Knee Immobilizer - Left: Discontinue once straight leg raise with < 10 degree lag Restrictions Weight Bearing Restrictions: No Other Position/Activity Restrictions: WBAT    Mobility  Bed Mobility Overal bed mobility: Needs Assistance Bed Mobility: Sit to Supine       Sit to supine: Min assist   General bed mobility comments: assist with LLE onto bed  Transfers Overall transfer level: Needs assistance Equipment used: Rolling walker (2 wheeled) Transfers: Sit to/from Stand Sit to Stand: Min guard         General transfer comment: cues for hand placement  Ambulation/Gait Ambulation/Gait assistance: Min guard Ambulation Distance (Feet): 80 Feet Assistive device: Rolling walker (2 wheeled) Gait Pattern/deviations: Step-to pattern;Antalgic;Trunk flexed     General Gait Details: cues fro sequence, posture, incr wt shift to LLE   Stairs            Wheelchair Mobility    Modified Rankin (Stroke Patients Only)       Balance                                            Cognition Arousal/Alertness: Awake/alert Behavior During Therapy: WFL for tasks assessed/performed Overall Cognitive Status: Within Functional Limits for tasks assessed                                        Exercises Total Joint Exercises Ankle Circles/Pumps: AROM;Both;10 reps Quad Sets: 10 reps;Both;AROM Heel Slides: AAROM;Left;10 reps Hip  ABduction/ADduction: AAROM;Left;10 reps Straight Leg Raises: AAROM;Left;10 reps Goniometric ROM: 5* to 65* AAROM L knee flexion    General Comments        Pertinent Vitals/Pain Pain Assessment: 0-10 Pain Score: 3  Faces Pain Scale: Hurts even more Pain Location: L knee Pain Descriptors / Indicators: Aching;Sore Pain Intervention(s): Limited activity within patient's tolerance;Monitored during session;Premedicated before session;Ice applied    Home Living Family/patient expects to be discharged to:: Private residence Living Arrangements: Alone Available Help at Discharge: Family;Friend(s) Type of Home: House Home Access: Stairs to enter Entrance Stairs-Rails: Right Home Layout: Two level;Full bath on main level;Able to live on main level with bedroom/bathroom Home Equipment: Bedside commode;Shower seat;Adaptive equipment Additional Comments: borrowed 3in1    Prior Function Level of Independence: Independent          PT Goals (current goals can now be found in the care plan section) Acute Rehab PT Goals Patient Stated Goal: less knee pain PT Goal Formulation: With patient Time For Goal Achievement: 08/27/16 Potential to Achieve Goals: Good Progress towards PT goals: Progressing toward goals    Frequency    7X/week      PT Plan Current plan remains appropriate    Co-evaluation              AM-PAC  PT "6 Clicks" Daily Activity  Outcome Measure  Difficulty turning over in bed (including adjusting bedclothes, sheets and blankets)?: A Little Difficulty moving from lying on back to sitting on the side of the bed? : A Little Difficulty sitting down on and standing up from a chair with arms (e.g., wheelchair, bedside commode, etc,.)?: A Little Help needed moving to and from a bed to chair (including a wheelchair)?: A Little Help needed walking in hospital room?: A Little Help needed climbing 3-5 steps with a railing? : A Little 6 Click Score: 18    End of  Session Equipment Utilized During Treatment: Gait belt Activity Tolerance: Patient tolerated treatment well Patient left: in bed;with call bell/phone within reach;with family/visitor present Nurse Communication: Mobility status PT Visit Diagnosis: Difficulty in walking, not elsewhere classified (R26.2);Pain Pain - Right/Left: Left Pain - part of body: Knee     Time: [id number] PT Time Calculation (min) (ACUTE ONLY): 25 min  Charges:  $Gait Training: 8-22 mins $Therapeutic Exercise: 8-22 mins                    G Codes:          Cynthis Purington 08/23/2016, 3:37 PM

## 2016-08-23 NOTE — Progress Notes (Signed)
CRITICAL VALUE ALERT  Critical value received:  Potassium 2.5 & Calcium 5.8  Date of notification:  08/23/16  Time of notification:  0558  Critical value read back:Yes.    Nurse who received alert:  S.Vennable RN  MD notified (1st page):  Ralene Batheracy Shuford PA  Time of first page: 0600  MD notified (2nd page):   Time of second page:  Responding MD: Ralene Batheracy Shuford PA  Time MD responded:  [phone]

## 2016-08-23 NOTE — Consult Note (Signed)
[facility]       PATIENT DETAILS Name: Carly Mitchell Age: 66 y.o. Sex: female Date of Birth: 07-Feb-1951 Admit Date: 08/22/2016 ZOX:[id number]:Kalish, Casimiro NeedleMichael, MD Requesting MD: Eugenia Mcalpineollins, Robert, MD   Date of consultation: 5/19   REASON FOR CONSULTATION:  Hypokalemia and hypocalcemia   HISTORY OF PRESENT ILLNESS: Carly Mitchell is an 66 y.o. female with past medical history of hypertension, underwent left knee arthroplasty yesterday-she was found to have hypokalemia and hypocalcemia, the primary service asked the [facility] service to evaluate and manage these electrolyte abnormalities.  No history of vomiting, fever, chest pain, shortness of breath.  He is lying comfortably in bed without any major issues.   REVIEW OF SYSTEMS:  Constitutional:   No  weight loss, night sweats,  Fevers, chills, fatigue.  HEENT:    No headaches, Difficulty swallowing,Tooth/dental problems,Sore throat,   Cardio-vascular: No chest pain,  Orthopnea, PND, swelling in lower extremities, anasarca  GI:  No heartburn, indigestion, abdominal pain, nausea, vomiting, diarrhea  Resp: No shortness of breath with exertion or at rest.  No excess mucus, no productive cough, No non-productive cough,    Skin:  no rash or lesions.  GU:  no dysuria, change in color of urine, no urgency or frequency.   Musculoskeletal:  No back pain.  Psych: No change in mood or affect. No depression or anxiety.  No memory loss.   ALLERGIES:   Allergies  Allergen Reactions  . Codeine Nausea Only  . Erythromycin Nausea Only  . Hydrocodone Nausea Only    PAST MEDICAL HISTORY: Past Medical History:  Diagnosis Date  . Arthritis   . Hypertension   . Urinary frequency     PAST SURGICAL HISTORY: Past Surgical History:  Procedure Laterality Date  . APPENDECTOMY    . BREAST SURGERY     bilateral lump aspiration   . KNEE ARTHROSCOPY W/ MENISCECTOMY Left 05/17/15  . TONSILLECTOMY       MEDICATIONS AT HOME: Prior to Admission medications   Medication Sig Start Date End Date Taking? Authorizing Provider  acetaminophen (TYLENOL) 500 MG tablet Take 1,000 mg by mouth at bedtime.    Yes [provider]  amLODipine (NORVASC) 5 MG tablet Take 5 mg by mouth daily.   Yes [provider]  Calcium Carbonate-Vitamin D (CALCIUM-D PO) Take 1 tablet by mouth daily.   Yes [provider]  co-enzyme Q-10 30 MG capsule Take 30 mg by mouth daily.   Yes [provider]  diclofenac sodium (VOLTAREN) 1 % GEL Apply 1 application topically daily as needed (pain).   Yes [provider]  famotidine (PEPCID) 20 MG tablet Take 20 mg by mouth at bedtime.    Yes [provider]  Glucosamine HCl (GLUCOSAMINE PO) Take 1 tablet by mouth daily.   Yes [provider]  Omega-3 Fatty Acids (FISH OIL) 1000 MG CAPS Take 1,000 mg by mouth daily.   Yes [provider]  omeprazole (PRILOSEC) 40 MG capsule Take 40 mg by mouth daily.   Yes [provider]  Pitavastatin Calcium (LIVALO) 1 MG TABS Take 0.5 mg by mouth. Every 3rd day   Yes [provider]  tolterodine (DETROL LA) 4 MG 24 hr capsule Take 4 mg by mouth daily.   Yes [provider]  triamcinolone cream (KENALOG) 0.1 % Apply 1 application topically daily as needed for rash. 04/30/15  Yes [provider]  valsartan (DIOVAN) 320 MG tablet Take 320 mg by  mouth daily.   Yes [provider]  zolpidem (AMBIEN) 10 MG tablet Take 5 mg by mouth at bedtime as needed for sleep.   Yes [provider]  aspirin EC 325 MG tablet Take 1 tablet (325 mg total) by mouth 2 (two) times daily. 08/22/16 08/22/17  Stilwell, Herbie Baltimore, PA-C  methocarbamol (ROBAXIN) 500 MG tablet Take 1 tablet (500 mg total) by mouth every 8 (eight) hours as needed for muscle spasms. 08/22/16   Stilwell, Bryson L, PA-C  oxyCODONE (ROXICODONE) 5 MG immediate release tablet Take 1-2  tablets (5-10 mg total) by mouth every 4 (four) hours as needed. 08/22/16   Stilwell, Herbie Baltimore, PA-C  promethazine (PHENERGAN) 12.5 MG tablet Take 1 tablet (12.5 mg total) by mouth every 6 (six) hours as needed for nausea or vomiting. 08/22/16   Stilwell, Herbie Baltimore, PA-C    FAMILY HISTORY: No family history of CAD  SOCIAL HISTORY:  reports that she quit smoking about 36 years ago. She has never used smokeless tobacco. She reports that she drinks about 0.6 oz of alcohol per week . She reports that she does not use drugs.  PHYSICAL EXAM: Blood pressure (!) 151/93, pulse 92, temperature 97.7 F (36.5 C), temperature source Oral, resp. rate 15, height 5\' 6"  (1.676 m), weight 87.1 kg (192 lb), SpO2 97 %. General appearance :Awake, alert, not in any distress. Speech Clear. Not toxic Looking Eyes:, pupils equally reactive to light and accomodation,no scleral icterus.Pink conjunctiva HEENT: Atraumatic and Normocephalic Neck: supple, no JVD. No cervical lymphadenopathy. No thyromegaly Resp:Good air entry bilaterally, no added sounds  CVS: S1 S2 regular, no murmurs.  GI: Bowel sounds present, Non tender and not distended with no gaurding, rigidity or rebound.No organomegaly Extremities: B/L Lower Ext shows no edema, both legs are warm to touch Neurology:  speech clear,Non focal, sensation is grossly intact. Psychiatric: Normal judgment and insight. Alert and oriented x 3. Normal mood. Musculoskeletal:gait appears to be normal.No digital cyanosis Skin:No Rash, warm and dry Wounds:N/A  Data reviewed:   I have personally reviewed following labs and imaging studies  LABORATORY DATA: CBC:  Recent Labs Lab 08/23/16 0458  WBC 13.4*  HGB 11.2*  HCT 32.8*  MCV 90.1  PLT 193    Basic Metabolic Panel:  Recent Labs Lab 08/23/16 0458 08/23/16 0907  NA 139 133*  K 2.5* 3.5  CL 114* 101  CO2 19* 23  GLUCOSE 114* 193*  BUN 10 12  CREATININE 0.36* 0.65  CALCIUM 5.8* 8.4*  MG  --  1.7    GFR Estimated Creatinine Clearance: 76.9 mL/min (by C-G formula based on SCr of 0.65 mg/dL).  Liver Function Tests:  Recent Labs Lab 08/23/16 0907  AST 31  ALT 36  ALKPHOS 57  BILITOT 0.5  PROT 6.7  ALBUMIN 3.6   No results for input(s): LIPASE, AMYLASE in the last 168 hours. No results for input(s): AMMONIA in the last 168 hours.  Coagulation profile No results for input(s): INR, PROTIME in the last 168 hours.  Cardiac Enzymes: No results for input(s): CKTOTAL, CKMB, CKMBINDEX, TROPONINI in the last 168 hours.  BNP: Invalid input(s): POCBNP  CBG: No results for input(s): GLUCAP in the last 168 hours.  D-Dimer No results for input(s): DDIMER in the last 72 hours.  Hgb A1c No results for input(s): HGBA1C in the last 72 hours.  Lipid Profile No results for input(s): CHOL, HDL, LDLCALC, TRIG, CHOLHDL, LDLDIRECT in the last 72 hours.  Thyroid function studies No  results for input(s): TSH, T4TOTAL, T3FREE, THYROIDAB in the last 72 hours.  Invalid input(s): FREET3  Anemia work up No results for input(s): VITAMINB12, FOLATE, FERRITIN, TIBC, IRON, RETICCTPCT in the last 72 hours.  Urinalysis    Component Value Date/Time   COLORURINE STRAW (A) 08/15/2016 1338   APPEARANCEUR CLEAR 08/15/2016 1338   LABSPEC 1.006 08/15/2016 1338   PHURINE 5.0 08/15/2016 1338   GLUCOSEU NEGATIVE 08/15/2016 1338   HGBUR NEGATIVE 08/15/2016 1338   BILIRUBINUR NEGATIVE 08/15/2016 1338   KETONESUR NEGATIVE 08/15/2016 1338   PROTEINUR NEGATIVE 08/15/2016 1338   NITRITE NEGATIVE 08/15/2016 1338   LEUKOCYTESUR NEGATIVE 08/15/2016 1338    Sepsis Labs Lactic Acid, Venous No results found for: LATICACIDVEN  Microbiology Recent Results (from the past 240 hour(s))  Surgical pcr screen     Status: None   Collection Time: 08/15/16  2:26 PM  Result Value Ref Range Status   MRSA, PCR NEGATIVE NEGATIVE Final   Staphylococcus aureus NEGATIVE NEGATIVE Final    Comment:        The Xpert  SA Assay (FDA approved for NASAL specimens in patients over 16 years of age), is one component of a comprehensive surveillance program.  Test performance has been validated by [facility] for patients greater than or equal to 28 year old. It is not intended to diagnose infection nor to guide or monitor treatment.      Radiological Exams on Admission: No results found.  Inpatient Medications:   Anti-infectives    Start     Dose/Rate Route Frequency Ordered Stop   08/22/16 1800  ceFAZolin (ANCEF) IVPB 2g/100 mL premix     2 g 200 mL/hr over 30 Minutes Intravenous Every 6 hours 08/22/16 1534 08/22/16 2339   08/22/16 1135  ceFAZolin (ANCEF) 2-4 GM/100ML-% IVPB    Comments:  Wylene Simmer   : cabinet override      08/22/16 1135 08/22/16 2344   08/22/16 0948  ceFAZolin (ANCEF) IVPB 2g/100 mL premix     2 g 200 mL/hr over 30 Minutes Intravenous On call to O.R. 08/22/16 0948 08/22/16 1212     Scheduled Meds: . acetaminophen  1,000 mg Oral Q8H  . amLODipine  5 mg Oral Daily  . dexamethasone  10 mg Intravenous Once  . docusate sodium  100 mg Oral BID  . enoxaparin (LOVENOX) injection  30 mg Subcutaneous Q12H  . famotidine  20 mg Oral QHS  . ferrous sulfate  325 mg Oral TID PC  . fesoterodine  4 mg Oral Daily  . irbesartan  300 mg Oral Daily  . pravastatin  40 mg Oral q1800   Continuous Infusions: . 0.9 % NaCl with KCl 40 mEq / L 75 mL/hr (08/23/16 0827)  . methocarbamol (ROBAXIN)  IV       Impression/Recommendations: Hypokalemia and hypocalcemia: Repeated blood work this morning-has very minimal hypocalcemia but I suspect her initial electrolyte abnormalities was a lab error. I do not think she needs any supplementation at this time. We'll recheck electrolytes tomorrow, and if they continue to be stable we will subsequently signed off.  Rest of the issues deferred to the primary team.  Total time spent 30 minutes.  Shanker Ghimire [facility] Pager  [phone]  If 7PM-7AM, please contact night-coverage [email or web address] Password [email or web address] 08/23/2016, 10:18 AM

## 2016-08-23 NOTE — Care Management Note (Signed)
Case Management Note  Patient Details  Name: Carly Mitchell MRN: [id number] Date of Birth: [birth year]  Subjective/Objective:     S/p L TKA               Action/Plan: Discharge Planning: [facility] Requesting RW and 3n1 bedside commode for home. Contacted [facility] DME rep for equipment to be delivered to room.   PCP Loyal JacobsonKALISH, MICHAEL MD  Expected Discharge Date:  08/22/16               Expected Discharge Plan:  Home w Home Health Services  In-House Referral:  NA  Discharge planning Services  CM Consult  Post Acute Care Choice:  Home Health Choice offered to:  Patient  DME Arranged:  3-N-1, Walker rolling DME Agency:  [facility]  HH Arranged:  PT HH Agency[facility]  [facility]  Status of Service:  Completed, signed off  If discussed at [employer] Length of Stay Meetings, dates discussed:    Additional Comments:  Elliot CousinShavis, Marylu Dudenhoeffer Ellen, RN 08/23/2016, 3:40 PM

## 2016-08-24 LAB — CBC
HCT: 34.5 % — ABNORMAL LOW (ref 36.0–46.0)
HEMOGLOBIN: 12 g/dL (ref 12.0–15.0)
MCH: 31.7 pg (ref 26.0–34.0)
MCHC: 34.8 g/dL (ref 30.0–36.0)
MCV: 91 fL (ref 78.0–100.0)
Platelets: 254 10*3/uL (ref 150–400)
RBC: 3.79 MIL/uL — ABNORMAL LOW (ref 3.87–5.11)
RDW: 13.2 % (ref 11.5–15.5)
WBC: 17.5 10*3/uL — ABNORMAL HIGH (ref 4.0–10.5)

## 2016-08-24 LAB — BASIC METABOLIC PANEL
Anion gap: 9 (ref 5–15)
BUN: 13 mg/dL (ref 6–20)
CHLORIDE: 104 mmol/L (ref 101–111)
CO2: 21 mmol/L — AB (ref 22–32)
CREATININE: 0.83 mg/dL (ref 0.44–1.00)
Calcium: 8.1 mg/dL — ABNORMAL LOW (ref 8.9–10.3)
GFR calc non Af Amer: >60 mL/min (ref >60)
GLUCOSE: 144 mg/dL — AB (ref 65–99)
Potassium: 5.4 mmol/L — ABNORMAL HIGH (ref 3.5–5.1)
SODIUM: 134 mmol/L — AB (ref 135–145)

## 2016-08-24 MED ORDER — SODIUM POLYSTYRENE SULFONATE 15 GM/60ML PO SUSP
15.0000 g | Freq: Once | ORAL | Status: AC
  Administered 2016-08-24: 15 g via ORAL
  Filled 2016-08-24: qty 60

## 2016-08-24 NOTE — Progress Notes (Signed)
Physical Therapy Treatment Patient Details Name: Carly Mitchell MRN: [id number] DOB: June 19, 1950 Today's Date: 08/24/2016    History of Present Illness s/p L TKA    PT Comments    Making excellent progress, all question and concerns regarding PT/mobility addressed; pt and family ready to D/ C today  Follow Up Recommendations  Home health PT     Equipment Recommendations  Rolling walker with 5" wheels    Recommendations for Other Services       Precautions / Restrictions Precautions Precautions: Fall;Knee Required Braces or Orthoses: Knee Immobilizer - Left Knee Immobilizer - Left: Discontinue once straight leg raise with < 10 degree lag Restrictions Weight Bearing Restrictions: No Other Position/Activity Restrictions: WBAT    Mobility  Bed Mobility Overal bed mobility: Needs Assistance Bed Mobility: Supine to Sit     Supine to sit: Min assist     General bed mobility comments: assist with LLE   Transfers Overall transfer level: Needs assistance Equipment used: Rolling walker (2 wheeled) Transfers: Sit to/from Stand Sit to Stand: Min guard         General transfer comment: cues for hand placement  Ambulation/Gait Ambulation/Gait assistance: Min guard Ambulation Distance (Feet): 65 Feet Assistive device: Rolling walker (2 wheeled) Gait Pattern/deviations: Step-to pattern;Antalgic;Trunk flexed     General Gait Details: cues for wt shift, decr antalgic gait   Stairs Stairs: Yes   Stair Management: Sideways;One rail Right;One rail Left;Step to pattern Number of Stairs: 5 (x2) General stair comments: cues for sequence and technique, family present for stair training  Wheelchair Mobility    Modified Rankin (Stroke Patients Only)       Balance                                            Cognition Arousal/Alertness: Awake/alert Behavior During Therapy: WFL for tasks assessed/performed Overall Cognitive Status: Within Functional  Limits for tasks assessed                                        Exercises Total Joint Exercises Ankle Circles/Pumps: AROM;Both;10 reps Quad Sets: 10 reps;Both;AROM Heel Slides: AAROM;Left;10 reps Hip ABduction/ADduction: AAROM;Left;10 reps Straight Leg Raises: AAROM;Left;10 reps Goniometric ROM: grossly 10* to 65*    General Comments        Pertinent Vitals/Pain Pain Assessment: 0-10 Pain Score: 5  Pain Location: L knee Pain Descriptors / Indicators: Aching;Sore;Burning Pain Intervention(s): Limited activity within patient's tolerance;Monitored during session;RN gave pain meds during session    Home Living                      Prior Function            PT Goals (current goals can now be found in the care plan section) Acute Rehab PT Goals Patient Stated Goal: less knee pain PT Goal Formulation: With patient Time For Goal Achievement: 08/27/16 Potential to Achieve Goals: Good Progress towards PT goals: Progressing toward goals    Frequency    7X/week      PT Plan Current plan remains appropriate    Co-evaluation              AM-PAC PT "6 Clicks" Daily Activity  Outcome Measure  Difficulty turning over in bed (including adjusting bedclothes,  sheets and blankets)?: A Little Difficulty moving from lying on back to sitting on the side of the bed? : A Little Difficulty sitting down on and standing up from a chair with arms (e.g., wheelchair, bedside commode, etc,.)?: A Little Help needed moving to and from a bed to chair (including a wheelchair)?: A Little Help needed walking in hospital room?: A Little Help needed climbing 3-5 steps with a railing? : A Little 6 Click Score: 18    End of Session Equipment Utilized During Treatment: Gait belt Activity Tolerance: Patient tolerated treatment well Patient left: in chair;with call bell/phone within reach;with family/visitor present Nurse Communication: Mobility status PT Visit  Diagnosis: Difficulty in walking, not elsewhere classified (R26.2);Pain Pain - Right/Left: Left Pain - part of body: Knee     Time: [id number] PT Time Calculation (min) (ACUTE ONLY): 50 min  Charges:  $Gait Training: 23-37 mins $Therapeutic Exercise: 8-22 mins                    G Codes:          Cliff Damiani 08/24/2016, 11:17 AM

## 2016-08-24 NOTE — Progress Notes (Signed)
Pt provided dc instructions and prescriptions; explained in detail. Pt verbalizes understanding. Pt escorted to lobby in wheel chair via nursing tech

## 2016-08-24 NOTE — Progress Notes (Signed)
Subjective: 2 Days Post-Op Procedure(s) (LRB): LEFT TOTAL KNEE ARTHROPLASTY (Left)  Patient reports pain as mild to moderate.  Tolerating POs well.  Admits to flatulence.  Denies fever, chills, N/V, CP, SOB.  Patient has been cleared to go home by Medicine team.  Patient reports that she is ready to go home.  Objective:   VITALS:  Temp:  [98.2 F (36.8 C)-98.7 F (37.1 C)] 98.7 F (37.1 C) (05/20 0512) Pulse Rate:  [79-88] 86 (05/20 0512) Resp:  [16-18] 16 (05/20 0512) BP: (139-163)/(69-80) 139/69 (05/20 0512) SpO2:  [95 %-98 %] 95 % (05/20 0512)  General: WDWN patient in NAD. Psych:  Appropriate mood and affect. Neuro:  A&O x 3, Moving all extremities, sensation intact to light touch HEENT:  EOMs intact Chest:  Even non-labored respirations Skin: Dressing C/D/I, no rashes or lesions Extremities: warm/dry, mild edema, no erythema or echymosis.  No lymphadenopathy. Pulses: Popliteus 2+ MSK:  ROM: lacks 5 degrees of TKE, MMT: able to perform quad set, (-) Homan's    LABS  Recent Labs  08/23/16 0458 08/24/16 0414  HGB 11.2* 12.0  WBC 13.4* 17.5*  PLT 193 254    Recent Labs  08/23/16 0907 08/24/16 0414  NA 133* 134*  K 3.5 5.4*  CL 101 104  CO2 23 21*  BUN 12 13  CREATININE 0.65 0.83  GLUCOSE 193* 144*   No results for input(s): LABPT, INR in the last 72 hours.   Assessment/Plan: 2 Days Post-Op Procedure(s) (LRB): LEFT TOTAL KNEE ARTHROPLASTY (Left)  D/C home with HHPT today WBAT R LE Scripts on chart Plan for 2 week outpatient post-op visit with Dr. Cristal Generousollins  Jamier Urbas, PA-C [facility] Office:  [phone]

## 2016-08-24 NOTE — Progress Notes (Signed)
PROGRESS NOTE  Carly Mitchell:[id number] DOB: 27-Apr-1950 DOA: 08/22/2016 PCP: Loyal Jacobson, MD   LOS: 2 days   Brief Narrative: 66 yo F with HTN, s/p left knee arthroplasty 2 days ago, found to have hypocalcemia and hypokalemia post op and hospitalists were consulted.  Assessment & Plan: Active Problems:   Osteoarthritis of left knee   S/P knee replacement   Hypocalcemia  -repeat blood work with Ca in the 8 range, suggesting the value of 5.8 was due to lab error. No further workup needed  Hypokalemia >> Hyperkalemia -repeat K last night normal, this morning she is slightly hyperkalemic due to IV fluids containing K -stop fluids, will give a dose of kayexalate  Leukocytosis -she is afebrile, no s/s suggestive on infection, likely reactive post op  OK to d/c from medicine stand point Will sign off thank you for the consult.   Family Communication: daughter bedside Disposition Plan: per primary team  Procedures:   TKA    Subjective: - no chest pain, shortness of breath, no abdominal pain, nausea or vomiting.   Objective: Vitals:   08/23/16 0940 08/23/16 1440 08/23/16 2158 08/24/16 0512  BP: (!) 144/78 (!) 163/80 (!) 150/74 139/69  Pulse: 88 86 79 86  Resp: 18 16 16 16   Temp: 98.5 F (36.9 C) 98.2 F (36.8 C) 98.5 F (36.9 C) 98.7 F (37.1 C)  TempSrc: Oral Oral Oral Oral  SpO2: 98% 95% 96% 95%  Weight:      Height:        Intake/Output Summary (Last 24 hours) at 08/24/16 0739 Last data filed at 08/24/16 0513  Gross per 24 hour  Intake             1320 ml  Output             3690 ml  Net            -2370 ml   Filed Weights   08/22/16 1005  Weight: 87.1 kg (192 lb)    Examination:  Vitals:   08/23/16 0940 08/23/16 1440 08/23/16 2158 08/24/16 0512  BP: (!) 144/78 (!) 163/80 (!) 150/74 139/69  Pulse: 88 86 79 86  Resp: 18 16 16 16   Temp: 98.5 F (36.9 C) 98.2 F (36.8 C) 98.5 F (36.9 C) 98.7 F (37.1 C)  TempSrc: Oral Oral Oral Oral    SpO2: 98% 95% 96% 95%  Weight:      Height:        Constitutional: NAD Respiratory: clear to auscultation bilaterally, no wheezing, no crackles. Normal respiratory effort. N Cardiovascular: Regular rate and rhythm, no murmurs / rubs / gallops. No LE edema.    Data Reviewed: I have personally reviewed following labs and imaging studies  CBC:  Recent Labs Lab 08/23/16 0458 08/24/16 0414  WBC 13.4* 17.5*  HGB 11.2* 12.0  HCT 32.8* 34.5*  MCV 90.1 91.0  PLT 193 254   Basic Metabolic Panel:  Recent Labs Lab 08/23/16 0458 08/23/16 0907 08/24/16 0414  NA 139 133* 134*  K 2.5* 3.5 5.4*  CL 114* 101 104  CO2 19* 23 21*  GLUCOSE 114* 193* 144*  BUN 10 12 13   CREATININE 0.36* 0.65 0.83  CALCIUM 5.8* 8.4* 8.1*  MG  --  1.7  --    GFR: Estimated Creatinine Clearance: 74.1 mL/min (by C-G formula based on SCr of 0.83 mg/dL). Liver Function Tests:  Recent Labs Lab 08/23/16 0907  AST 31  ALT 36  ALKPHOS 57  BILITOT 0.5  PROT 6.7  ALBUMIN 3.6   No results for input(s): LIPASE, AMYLASE in the last 168 hours. No results for input(s): AMMONIA in the last 168 hours. Coagulation Profile: No results for input(s): INR, PROTIME in the last 168 hours. Cardiac Enzymes: No results for input(s): CKTOTAL, CKMB, CKMBINDEX, TROPONINI in the last 168 hours. BNP (last 3 results) No results for input(s): PROBNP in the last 8760 hours. HbA1C: No results for input(s): HGBA1C in the last 72 hours. CBG: No results for input(s): GLUCAP in the last 168 hours. Lipid Profile: No results for input(s): CHOL, HDL, LDLCALC, TRIG, CHOLHDL, LDLDIRECT in the last 72 hours. Thyroid Function Tests: No results for input(s): TSH, T4TOTAL, FREET4, T3FREE, THYROIDAB in the last 72 hours. Anemia Panel: No results for input(s): VITAMINB12, FOLATE, FERRITIN, TIBC, IRON, RETICCTPCT in the last 72 hours. Urine analysis:    Component Value Date/Time   COLORURINE STRAW (A) 08/15/2016 1338    APPEARANCEUR CLEAR 08/15/2016 1338   LABSPEC 1.006 08/15/2016 1338   PHURINE 5.0 08/15/2016 1338   GLUCOSEU NEGATIVE 08/15/2016 1338   HGBUR NEGATIVE 08/15/2016 1338   BILIRUBINUR NEGATIVE 08/15/2016 1338   KETONESUR NEGATIVE 08/15/2016 1338   PROTEINUR NEGATIVE 08/15/2016 1338   NITRITE NEGATIVE 08/15/2016 1338   LEUKOCYTESUR NEGATIVE 08/15/2016 1338   Sepsis Labs: Invalid input(s): PROCALCITONIN, LACTICIDVEN  Recent Results (from the past 240 hour(s))  Surgical pcr screen     Status: None   Collection Time: 08/15/16  2:26 PM  Result Value Ref Range Status   MRSA, PCR NEGATIVE NEGATIVE Final   Staphylococcus aureus NEGATIVE NEGATIVE Final    Comment:        The Xpert SA Assay (FDA approved for NASAL specimens in patients over 67 years of age), is one component of a comprehensive surveillance program.  Test performance has been validated by [facility] for patients greater than or equal to 67 year old. It is not intended to diagnose infection nor to guide or monitor treatment.       Radiology Studies: No results found.   Scheduled Meds: . acetaminophen  1,000 mg Oral Q8H  . amLODipine  5 mg Oral Daily  . docusate sodium  100 mg Oral BID  . enoxaparin (LOVENOX) injection  30 mg Subcutaneous Q12H  . famotidine  20 mg Oral QHS  . ferrous sulfate  325 mg Oral TID PC  . fesoterodine  4 mg Oral Daily  . irbesartan  300 mg Oral Daily  . pravastatin  40 mg Oral q1800  . sodium polystyrene  15 g Oral Once   Continuous Infusions: . methocarbamol (ROBAXIN)  IV      Pamella Pertostin Chinara Hertzberg, MD, PhD [facility] Pager [phone]  If 7PM-7AM, please contact night-coverage [email or web address] Password [email or web address] 08/24/2016, 7:39 AM

## 2016-08-24 NOTE — Progress Notes (Signed)
Pt states wants tramadol prescription as opposed to oxycodone. Jill AlexandersJustin PA notified. Jill AlexandersJustin PA states will call in prescription to [place] pharmacy [place]

## 2016-08-25 ENCOUNTER — Encounter (HOSPITAL_COMMUNITY): Payer: Self-pay | Admitting: Specialist

## 2016-08-27 NOTE — Discharge Summary (Signed)
Physician Discharge Summary  Patient ID: Carly Mitchell MRN: [id number] DOB/AGE: [birth year] 66 y.o.  Admit date: 08/22/2016 Discharge date: 08/27/2016  Admission Diagnoses:Left knee OA  Discharge Diagnoses:  Active Problems:   Osteoarthritis of left knee   S/P knee replacement   Discharged Condition: good  Hospital Course:  Carly Mitchell is a 66 y.o. who was admitted to [facility]. They were brought to the operating room on 08/22/2016 and underwent Procedure(s): LEFT TOTAL KNEE ARTHROPLASTY.  Patient tolerated the procedure well and was later transferred to the recovery room and then to the orthopaedic floor for postoperative care.  They were given PO and IV analgesics for pain control following their surgery.  They were given 24 hours of postoperative antibiotics of  Anti-infectives    Start     Dose/Rate Route Frequency Ordered Stop   08/22/16 1800  ceFAZolin (ANCEF) IVPB 2g/100 mL premix     2 g 200 mL/hr over 30 Minutes Intravenous Every 6 hours 08/22/16 1534 08/22/16 2339   08/22/16 1135  ceFAZolin (ANCEF) 2-4 GM/100ML-% IVPB    Comments:  Wylene Simmer   : cabinet override      08/22/16 1135 08/22/16 2344   08/22/16 0948  ceFAZolin (ANCEF) IVPB 2g/100 mL premix     2 g 200 mL/hr over 30 Minutes Intravenous On call to O.R. 08/22/16 7829 08/22/16 1212     and started on DVT prophylaxis in the form of lovenox.   PT and OT were ordered for total joint protocol.  Discharge planning consulted to help with postop disposition and equipment needs.  Patient had a good night on the evening of surgery and started to get up OOB with therapy on day one.  Hemovac drain was pulled without difficulty.  Continued to work with therapy into day two.  Dressing was with normal limits.  The patient had progressed with therapy and meeting their goals. Patient was seen in rounds and was ready to go home.  Consults:n/a  Significant Diagnostic Studies: routine  Treatments:  routine  Discharge Exam: Blood pressure 139/69, pulse 86, temperature 98.7 F (37.1 C), temperature source Oral, resp. rate 16, height 5\' 6"  (1.676 m), weight 87.1 kg (192 lb), SpO2 95 %. Alert and oriented x3. RRR, Lungs clear, BS x4. Left Calf soft and non tender. L knee dressing C/D/I. No DVT signs. No signs of infection or compartment syndrome. LLE grossly neurovascularly intact.   Disposition: 01-Home or Self Care  Discharge Instructions    Call MD / Call 911    Complete by:  As directed    If you experience chest pain or shortness of breath, CALL 911 and be transported to the hospital emergency room.  If you develope a fever above 101 F, pus (Mat[place] drainage) or increased drainage or redness at the wound, or calf pain, call your surgeon's office.   Call MD / Call 911    Complete by:  As directed    If you experience chest pain or shortness of breath, CALL 911 and be transported to the hospital emergency room.  If you develope a fever above 101 F, pus ([place] drainage) or increased drainage or redness at the wound, or calf pain, call your surgeon's office.   Constipation Prevention    Complete by:  As directed    Drink plenty of fluids.  Prune juice may be helpful.  You may use a stool softener, such as Colace (over the counter) 100 mg twice a day.  Use  MiraLax (over the counter) for constipation as needed.   Constipation Prevention    Complete by:  As directed    Drink plenty of fluids.  Prune juice may be helpful.  You may use a stool softener, such as Colace (over the counter) 100 mg twice a day.  Use MiraLax (over the counter) for constipation as needed.   Diet - low sodium heart healthy    Complete by:  As directed    Diet - low sodium heart healthy    Complete by:  As directed    Discharge instructions    Complete by:  As directed    INSTRUCTIONS AFTER JOINT REPLACEMENT   Remove items at home which could result in a fall. This includes throw rugs or furniture in walking  pathways ICE to the affected joint every three hours while awake for 30 minutes at a time, for at least the first 3-5 days, and then as needed for pain and swelling.  Continue to use ice for pain and swelling. You may notice swelling that will progress down to the foot and ankle.  This is normal after surgery.  Elevate your leg when you are not up walking on it.   Continue to use the breathing machine you got in the hospital (incentive spirometer) which will help keep your temperature down.  It is common for your temperature to cycle up and down following surgery, especially at night when you are not up moving around and exerting yourself.  The breathing machine keeps your lungs expanded and your temperature down.   DIET:  As you were doing prior to hospitalization, we recommend a well-balanced diet.  DRESSING / WOUND CARE / SHOWERING  Keep the surgical dressing until follow up.  The dressing is water proof, so you can shower without any extra covering.  IF THE DRESSING FALLS OFF or the wound gets wet inside, change the dressing with sterile gauze.  Please use good hand washing techniques before changing the dressing.  Do not use any lotions or creams on the incision until instructed by your surgeon.    ACTIVITY  Increase activity slowly as tolerated, but follow the weight bearing instructions below.   No driving for 6 weeks or until further direction given by your physician.  You cannot drive while taking narcotics.  No lifting or carrying greater than 10 lbs. until further directed by your surgeon. Avoid periods of inactivity such as sitting longer than an hour when not asleep. This helps prevent blood clots.  You may return to work once you are authorized by your doctor.     WEIGHT BEARING   Weight bearing as tolerated with assist device (walker, cane, etc) as directed, use it as long as suggested by your surgeon or therapist, typically at least 4-6 weeks.   EXERCISES  Results after  joint replacement surgery are often greatly improved when you follow the exercise, range of motion and muscle strengthening exercises prescribed by your doctor. Safety measures are also important to protect the joint from further injury. Any time any of these exercises cause you to have increased pain or swelling, decrease what you are doing until you are comfortable again and then slowly increase them. If you have problems or questions, call your caregiver or physical therapist for advice.   Rehabilitation is important following a joint replacement. After just a few days of immobilization, the muscles of the leg can become weakened and shrink (atrophy).  These exercises are designed to build up  the tone and strength of the thigh and leg muscles and to improve motion. Often times heat used for twenty to thirty minutes before working out will loosen up your tissues and help with improving the range of motion but do not use heat for the first two weeks following surgery (sometimes heat can increase post-operative swelling).   These exercises can be done on a training (exercise) mat, on the floor, on a table or on a bed. Use whatever works the best and is most comfortable for you.    Use music or television while you are exercising so that the exercises are a pleasant break in your day. This will make your life better with the exercises acting as a break in your routine that you can look forward to.   Perform all exercises about fifteen times, three times per day or as directed.  You should exercise both the operative leg and the other leg as well.   Exercises include:   Quad Sets - Tighten up the muscle on the front of the thigh (Quad) and hold for 5-10 seconds.   Straight Leg Raises - With your knee straight (if you were given a brace, keep it on), lift the leg to 60 degrees, hold for 3 seconds, and slowly lower the leg.  Perform this exercise against resistance later as your leg gets stronger.  Leg Slides:  Lying on your back, slowly slide your foot toward your buttocks, bending your knee up off the floor (only go as far as is comfortable). Then slowly slide your foot back down until your leg is flat on the floor again.  Angel Wings: Lying on your back spread your legs to the side as far apart as you can without causing discomfort.  Hamstring Strength:  Lying on your back, push your heel against the floor with your leg straight by tightening up the muscles of your buttocks.  Repeat, but this time bend your knee to a comfortable angle, and push your heel against the floor.  You may put a pillow under the heel to make it more comfortable if necessary.   A rehabilitation program following joint replacement surgery can speed recovery and prevent re-injury in the future due to weakened muscles. Contact your doctor or a physical therapist for more information on knee rehabilitation.    CONSTIPATION  Constipation is defined medically as fewer than three stools per week and severe constipation as less than one stool per week.  Even if you have a regular bowel pattern at home, your normal regimen is likely to be disrupted due to multiple reasons following surgery.  Combination of anesthesia, postoperative narcotics, change in appetite and fluid intake all can affect your bowels.   YOU MUST use at least one of the following options; they are listed in order of increasing strength to get the job done.  They are all available over the counter, and you may need to use some, POSSIBLY even all of these options:    Drink plenty of fluids (prune juice may be helpful) and high fiber foods Colace 100 mg by mouth twice a day  Senokot for constipation as directed and as needed Dulcolax (bisacodyl), take with full glass of water  Miralax (polyethylene glycol) once or twice a day as needed.  If you have tried all these things and are unable to have a bowel movement in the first 3-4 days after surgery call either your  surgeon or your primary doctor.    If you  experience loose stools or diarrhea, hold the medications until you stool forms back up.  If your symptoms do not get better within 1 week or if they get worse, check with your doctor.  If you experience "the worst abdominal pain ever" or develop nausea or vomiting, please contact the office immediately for further recommendations for treatment.   ITCHING:  If you experience itching with your medications, try taking only a single pain pill, or even half a pain pill at a time.  You can also use Benadryl over the counter for itching or also to help with sleep.   TED HOSE STOCKINGS:  Use stockings on both legs until for at least 2 weeks or as directed by physician office. They may be removed at night for sleeping.  MEDICATIONS:  See your medication summary on the "After Visit Summary" that nursing will review with you.  You may have some home medications which will be placed on hold until you complete the course of blood thinner medication.  It is important for you to complete the blood thinner medication as prescribed.  PRECAUTIONS:  If you experience chest pain or shortness of breath - call 911 immediately for transfer to the hospital emergency department.   If you develop a fever greater that 101 F, purulent drainage from wound, increased redness or drainage from wound, foul odor from the wound/dressing, or calf pain - CONTACT YOUR SURGEON.                                                   FOLLOW-UP APPOINTMENTS:  If you do not already have a post-op appointment, please call the office for an appointment to be seen by your surgeon.  Guidelines for how soon to be seen are listed in your "After Visit Summary", but are typically between 1-4 weeks after surgery.  OTHER INSTRUCTIONS:   Knee Replacement:  Do not place pillow under knee, focus on keeping the knee straight while resting. CPM instructions: 0-90 degrees, 2 hours in the morning, 2 hours in the  afternoon, and 2 hours in the evening. Place foam block, curve side up under heel at all times except when in CPM or when walking.  DO NOT modify, tear, cut, or change the foam block in any way.  MAKE SURE YOU:  Understand these instructions.  Get help right away if you are not doing well or get worse.    Thank you for letting us be a part of your medical care team.  It is a privilege we respect greatly.  We hope these instructions will help you stay on track for a fast and full recovery!   Increase activity slowly as tolerated    Complete by:  As directed    Increase activity slowly as tolerated    Complete by:  As directed    Weight bearing as tolerated    Complete by:  As directed    Laterality:  right   Extremity:  Lower     Allergies as of 08/24/2016      Reactions   Codeine Nausea Only   Erythromycin Nausea Only   Hydrocodone Nausea Only      Medication List    TAKE these medications   acetaminophen 500 MG tablet Commonly known as:  TYLENOL Take 1,000 mg by mouth at bedtime.   amLODipine 5  MG tablet Commonly known as:  NORVASC Take 5 mg by mouth daily.   aspirin EC 325 MG tablet Take 1 tablet (325 mg total) by mouth 2 (two) times daily.   CALCIUM-D PO Take 1 tablet by mouth daily.   co-enzyme Q-10 30 MG capsule Take 30 mg by mouth daily.   diclofenac sodium 1 % Gel Commonly known as:  VOLTAREN Apply 1 application topically daily as needed (pain).   famotidine 20 MG tablet Commonly known as:  PEPCID Take 20 mg by mouth at bedtime.   Fish Oil 1000 MG Caps Take 1,000 mg by mouth daily.   GLUCOSAMINE PO Take 1 tablet by mouth daily.   LIVALO 1 MG Tabs Generic drug:  Pitavastatin Calcium Take 0.5 mg by mouth. Every 3rd day   methocarbamol 500 MG tablet Commonly known as:  ROBAXIN Take 1 tablet (500 mg total) by mouth every 8 (eight) hours as needed for muscle spasms.   omeprazole 40 MG capsule Commonly known as:  PRILOSEC Take 40 mg by mouth  daily.   oxyCODONE 5 MG immediate release tablet Commonly known as:  ROXICODONE Take 1-2 tablets (5-10 mg total) by mouth every 4 (four) hours as needed.   promethazine 12.5 MG tablet Commonly known as:  PHENERGAN Take 1 tablet (12.5 mg total) by mouth every 6 (six) hours as needed for nausea or vomiting.   tolterodine 4 MG 24 hr capsule Commonly known as:  DETROL LA Take 4 mg by mouth daily.   triamcinolone cream 0.1 % Commonly known as:  KENALOG Apply 1 application topically daily as needed for rash.   valsartan 320 MG tablet Commonly known as:  DIOVAN Take 320 mg by mouth daily.   zolpidem 10 MG tablet Commonly known as:  AMBIEN Take 5 mg by mouth at bedtime as needed for sleep.      Follow-up Information    [facility] Follow up.   Specialty:  Home Health Services Why:  Home Health Physical Therapy Contact information: [address] [place] [address] [phone]        Eugenia Mcalpine, MD. Schedule an appointment as soon as possible for a visit in 2 week(s).   Specialty:  Orthopedic Surgery Contact information: [address] [place] [address] [phone]           Signed: Markham Jordan 08/27/2016, 2:01 PM

## 2016-10-22 ENCOUNTER — Other Ambulatory Visit: Payer: Self-pay | Admitting: Obstetrics & Gynecology

## 2016-10-22 DIAGNOSIS — Z1231 Encounter for screening mammogram for malignant neoplasm of breast: Secondary | ICD-10-CM

## 2016-10-31 ENCOUNTER — Ambulatory Visit
Admission: RE | Admit: 2016-10-31 | Discharge: 2016-10-31 | Disposition: A | Discharge status: Home / Self Care | Payer: Medicare Other | Source: Ambulatory Visit | Attending: Obstetrics & Gynecology | Admitting: Obstetrics & Gynecology

## 2016-10-31 DIAGNOSIS — Z1231 Encounter for screening mammogram for malignant neoplasm of breast: Secondary | ICD-10-CM

## 2017-10-01 ENCOUNTER — Other Ambulatory Visit: Payer: Self-pay | Admitting: Obstetrics & Gynecology

## 2017-10-01 DIAGNOSIS — Z1231 Encounter for screening mammogram for malignant neoplasm of breast: Secondary | ICD-10-CM

## 2017-11-02 ENCOUNTER — Ambulatory Visit
Admission: RE | Admit: 2017-11-02 | Discharge: 2017-11-02 | Disposition: A | Discharge status: Home / Self Care | Payer: Medicare Other | Source: Ambulatory Visit | Attending: Obstetrics & Gynecology | Admitting: Obstetrics & Gynecology

## 2017-11-02 DIAGNOSIS — Z1231 Encounter for screening mammogram for malignant neoplasm of breast: Secondary | ICD-10-CM

## 2018-11-17 ENCOUNTER — Other Ambulatory Visit: Payer: Self-pay | Admitting: Obstetrics & Gynecology

## 2018-11-17 DIAGNOSIS — Z1231 Encounter for screening mammogram for malignant neoplasm of breast: Secondary | ICD-10-CM

## 2019-01-04 ENCOUNTER — Ambulatory Visit
Admission: RE | Admit: 2019-01-04 | Discharge: 2019-01-04 | Disposition: A | Discharge status: Home / Self Care | Payer: Medicare Other | Source: Ambulatory Visit | Attending: Obstetrics & Gynecology | Admitting: Obstetrics & Gynecology

## 2019-01-04 ENCOUNTER — Other Ambulatory Visit: Payer: Self-pay

## 2019-01-04 DIAGNOSIS — Z1231 Encounter for screening mammogram for malignant neoplasm of breast: Secondary | ICD-10-CM

## 2019-01-05 ENCOUNTER — Other Ambulatory Visit: Payer: Self-pay | Admitting: Obstetrics & Gynecology

## 2019-01-05 DIAGNOSIS — R928 Other abnormal and inconclusive findings on diagnostic imaging of breast: Secondary | ICD-10-CM

## 2019-01-06 ENCOUNTER — Other Ambulatory Visit: Payer: Self-pay | Admitting: Obstetrics & Gynecology

## 2019-01-06 ENCOUNTER — Ambulatory Visit
Admission: RE | Admit: 2019-01-06 | Discharge: 2019-01-06 | Disposition: A | Discharge status: Home / Self Care | Payer: Medicare Other | Source: Ambulatory Visit | Attending: Obstetrics & Gynecology | Admitting: Obstetrics & Gynecology

## 2019-01-06 ENCOUNTER — Other Ambulatory Visit: Payer: Self-pay

## 2019-01-06 DIAGNOSIS — R928 Other abnormal and inconclusive findings on diagnostic imaging of breast: Secondary | ICD-10-CM

## 2019-05-09 ENCOUNTER — Ambulatory Visit: Payer: Medicare Other

## 2019-12-01 ENCOUNTER — Other Ambulatory Visit: Payer: Self-pay | Admitting: Obstetrics & Gynecology

## 2019-12-01 DIAGNOSIS — Z1231 Encounter for screening mammogram for malignant neoplasm of breast: Secondary | ICD-10-CM

## 2020-01-05 ENCOUNTER — Ambulatory Visit: Payer: Medicare Other

## 2020-02-21 ENCOUNTER — Ambulatory Visit
Admission: RE | Admit: 2020-02-21 | Discharge: 2020-02-21 | Disposition: A | Discharge status: Home / Self Care | Payer: Medicare PPO | Source: Ambulatory Visit | Attending: Obstetrics & Gynecology | Admitting: Obstetrics & Gynecology

## 2020-02-21 ENCOUNTER — Other Ambulatory Visit: Payer: Self-pay

## 2020-02-21 DIAGNOSIS — Z1231 Encounter for screening mammogram for malignant neoplasm of breast: Secondary | ICD-10-CM

## 2020-11-14 IMAGING — MG MM DIGITAL SCREENING BILAT W/ TOMO W/ CAD
8 series · 8 of 24 positions shown · non-contrast
Comparison: Previous exam(s).
COMPARISON: Previous exam(s).

Addendum:
CLINICAL DATA: Screening.

EXAM:
DIGITAL SCREENING BILATERAL MAMMOGRAM WITH TOMO AND CAD

[L MLO synth-2D]
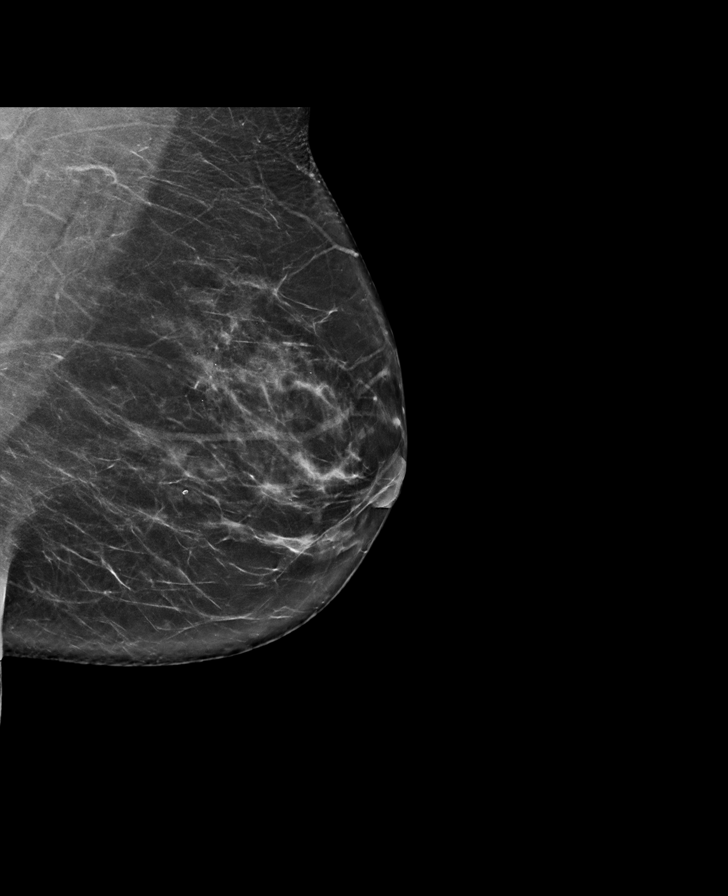

[R CC synth-2D]
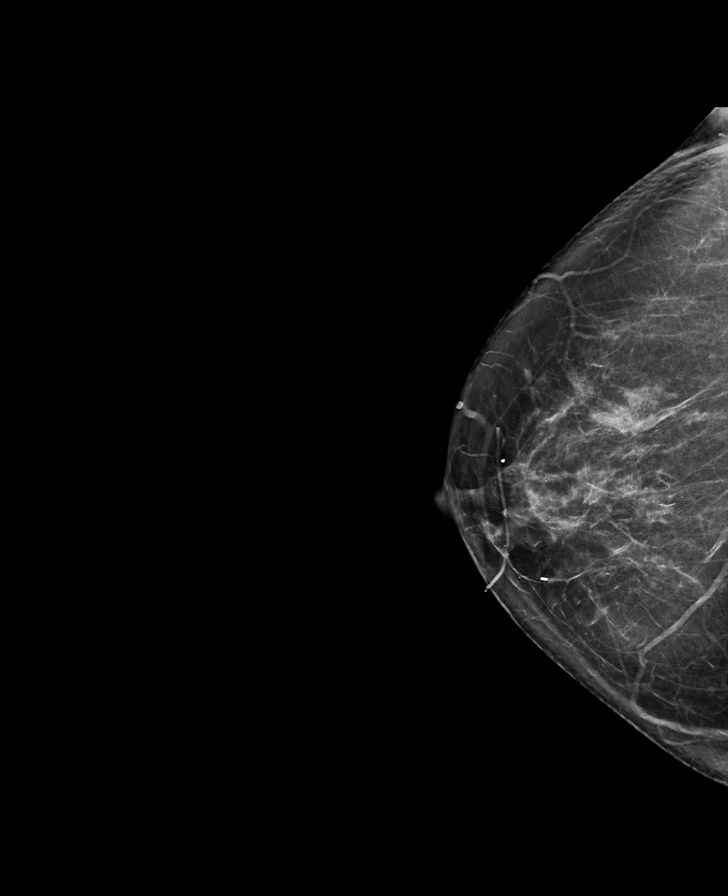

[R MLO synth-2D]
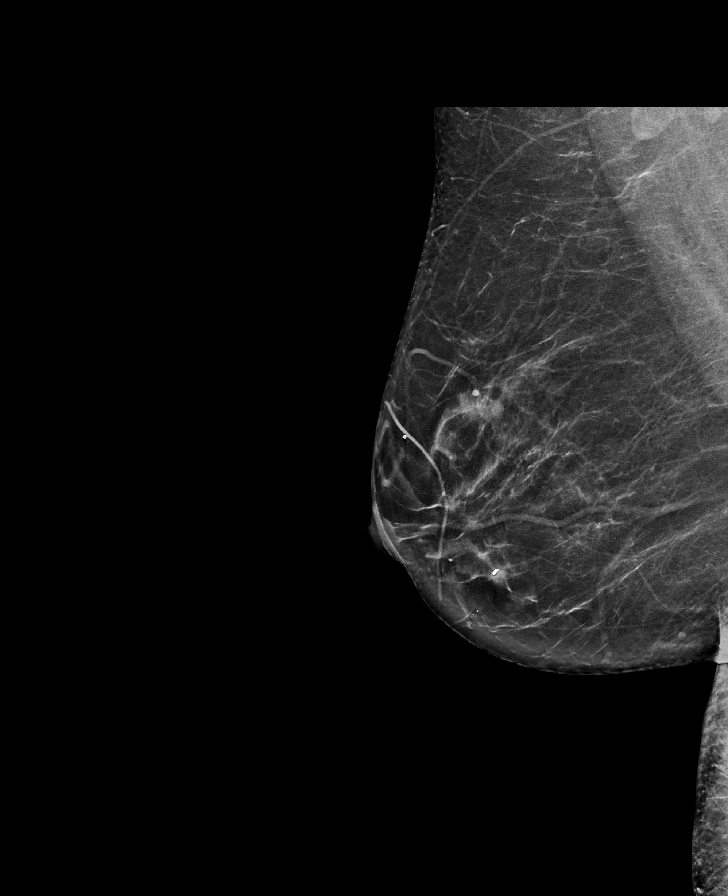

[L CC synth-2D]
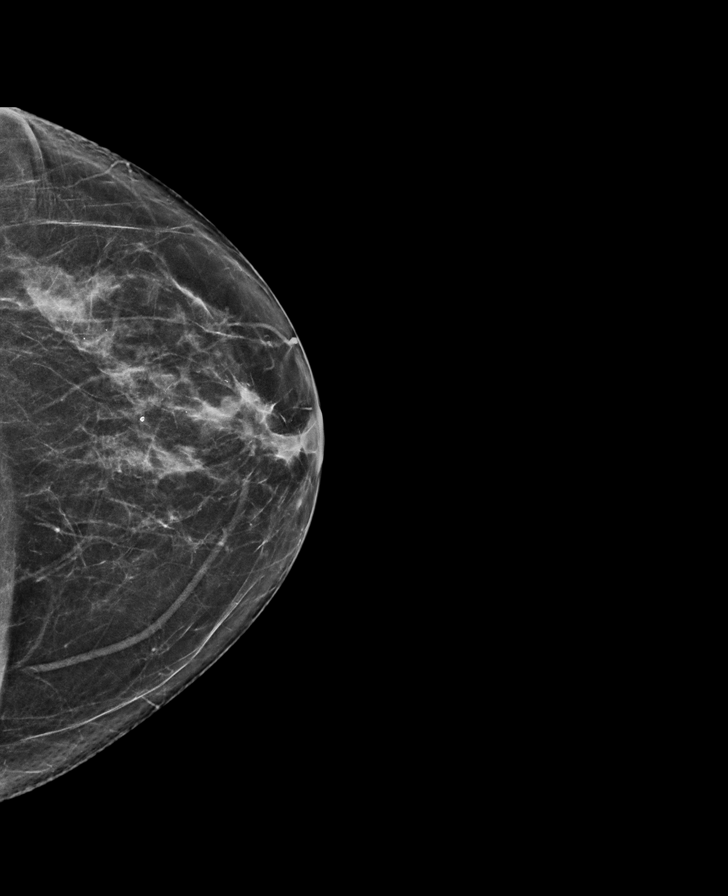

[R MLO tomo · tomo slice 38/75.0]
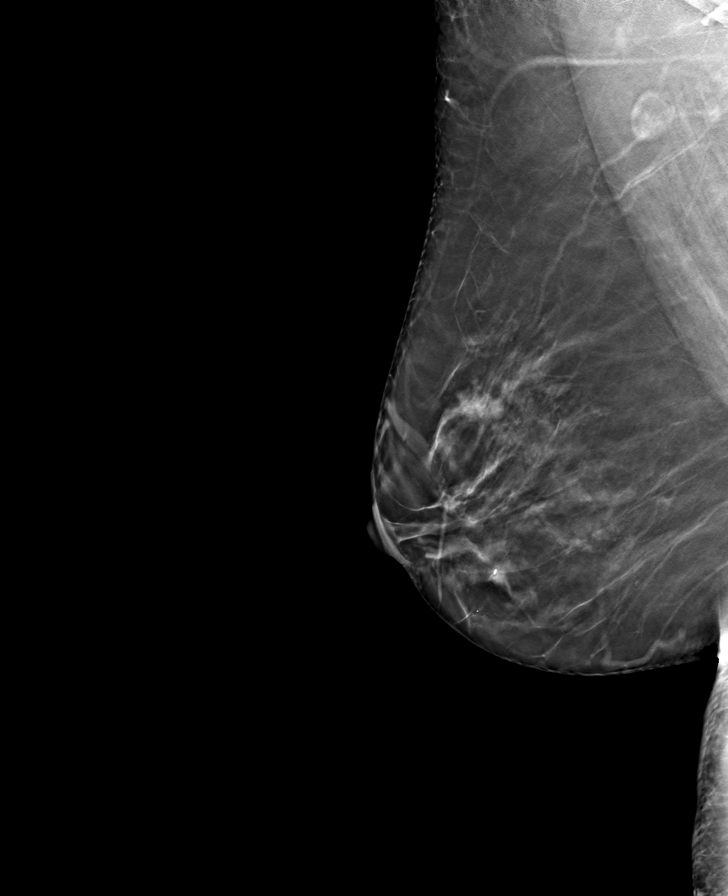

[L CC tomo · tomo slice 35/70.0]
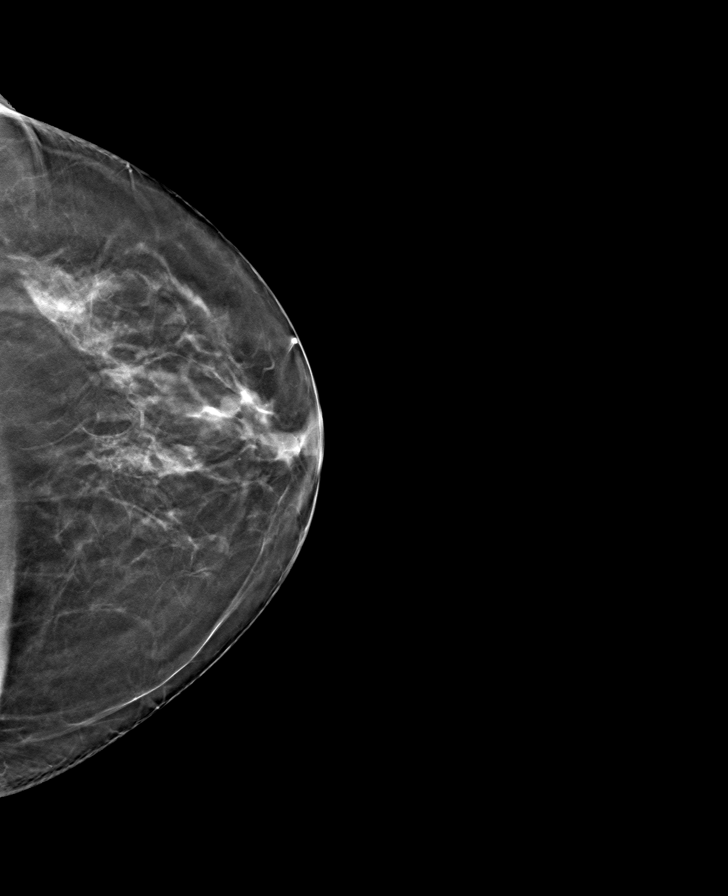

[L MLO tomo · tomo slice 39/77.0]
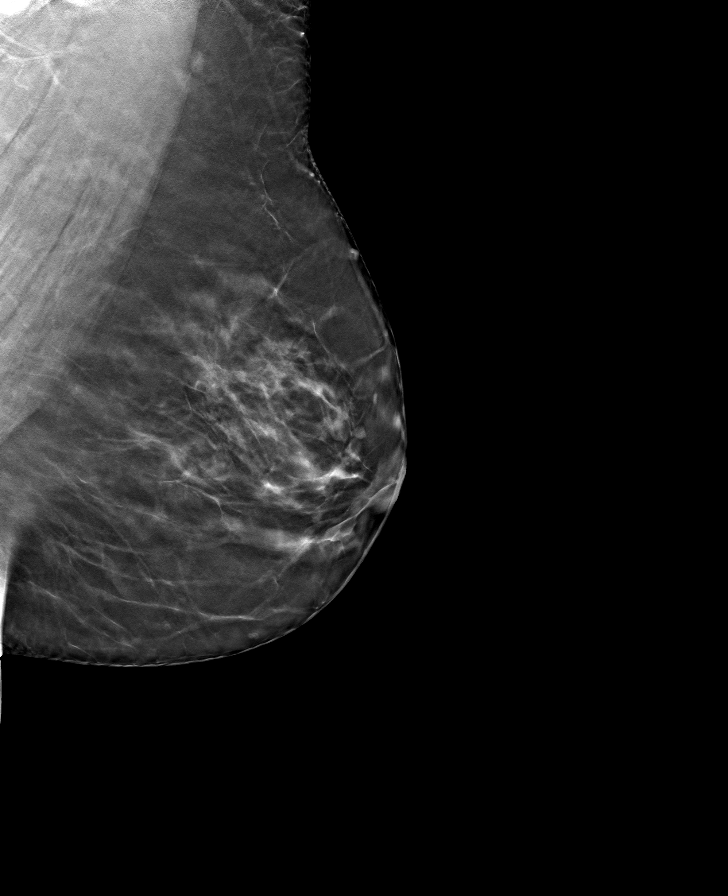

[R CC tomo · tomo slice 35/70.0]
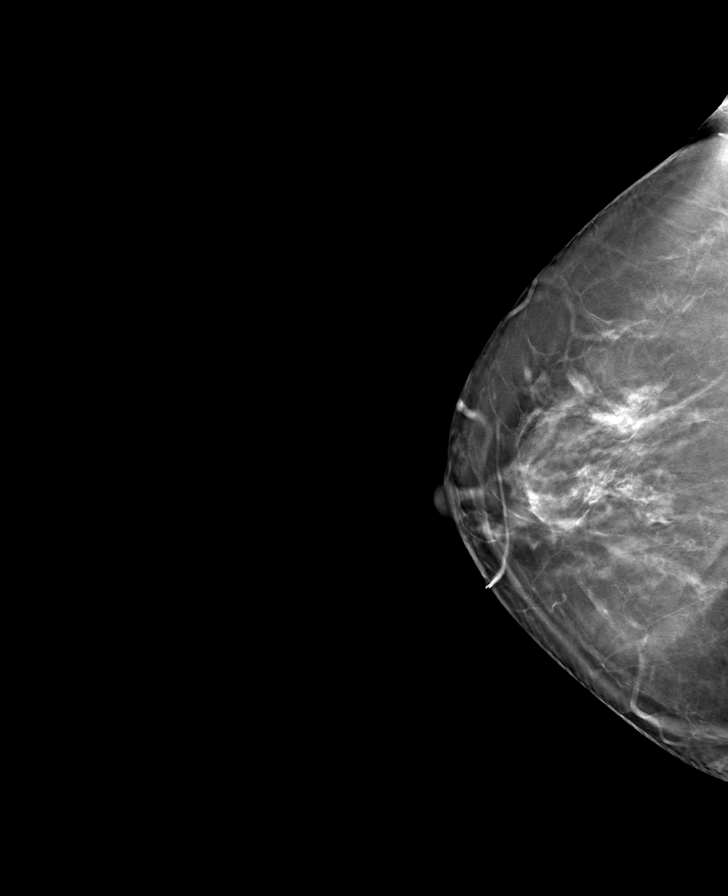

[8 of 24 positions shown; findings below may reference images not displayed]

ACR Breast Density Category b: There are scattered areas of
fibroglandular density.
FINDINGS: In the left breast, a possible mass should she choose warrants
further evaluation. In the right breast, no findings suspicious for
malignancy.

Images were processed with CAD.
IMPRESSION: Further evaluation is suggested for possible mass and possible
calcifications in the left breast.

RECOMMENDATION:
Diagnostic mammogram and possibly ultrasound of the left breast.
(Code:M7-I-II6)

The patient will be contacted regarding the findings, and additional
imaging will be scheduled.

BI-RADS CATEGORY  0: Incomplete. Need additional imaging evaluation
and/or prior mammograms for comparison.

ADDENDUM:
Findings section of the report should say in the right breast, there
is possible mass and possible calcifications. The left breasts is
negative.
IMPRESSION: Possible mass and possible calcifications upper right breast.

Recommendation:

Right diagnostic mammogram and possible ultrasound of right breast.

*** End of Addendum ***
ACR Breast Density Category b: There are scattered areas of
fibroglandular density.
FINDINGS: In the left breast, a possible mass should she choose warrants
further evaluation. In the right breast, no findings suspicious for
malignancy.

Images were processed with CAD.
IMPRESSION: Further evaluation is suggested for possible mass and possible
calcifications in the left breast.

RECOMMENDATION:
Diagnostic mammogram and possibly ultrasound of the left breast.
(Code:M7-I-II6)

The patient will be contacted regarding the findings, and additional
imaging will be scheduled.

BI-RADS CATEGORY  0: Incomplete. Need additional imaging evaluation
and/or prior mammograms for comparison.

## 2021-01-15 ENCOUNTER — Other Ambulatory Visit: Payer: Self-pay | Admitting: Obstetrics & Gynecology

## 2021-01-15 DIAGNOSIS — Z1231 Encounter for screening mammogram for malignant neoplasm of breast: Secondary | ICD-10-CM

## 2021-02-21 ENCOUNTER — Ambulatory Visit
Admission: RE | Admit: 2021-02-21 | Discharge: 2021-02-21 | Disposition: A | Discharge status: Home / Self Care | Payer: Medicare PPO | Source: Ambulatory Visit | Attending: Obstetrics & Gynecology | Admitting: Obstetrics & Gynecology

## 2021-02-21 ENCOUNTER — Other Ambulatory Visit: Payer: Self-pay

## 2021-02-21 DIAGNOSIS — Z1231 Encounter for screening mammogram for malignant neoplasm of breast: Secondary | ICD-10-CM

## 2022-01-08 ENCOUNTER — Other Ambulatory Visit: Payer: Self-pay | Admitting: Family Medicine

## 2022-01-08 DIAGNOSIS — Z1231 Encounter for screening mammogram for malignant neoplasm of breast: Secondary | ICD-10-CM

## 2022-02-25 ENCOUNTER — Ambulatory Visit: Payer: Medicare PPO

## 2022-04-24 ENCOUNTER — Ambulatory Visit
Admission: RE | Admit: 2022-04-24 | Discharge: 2022-04-24 | Disposition: A | Discharge status: Home / Self Care | Payer: Medicare PPO | Source: Ambulatory Visit | Attending: Family Medicine | Admitting: Family Medicine

## 2022-04-24 DIAGNOSIS — Z1231 Encounter for screening mammogram for malignant neoplasm of breast: Secondary | ICD-10-CM

## 2023-04-06 ENCOUNTER — Other Ambulatory Visit: Payer: Self-pay | Admitting: Family Medicine

## 2023-04-06 DIAGNOSIS — Z1231 Encounter for screening mammogram for malignant neoplasm of breast: Secondary | ICD-10-CM

## 2023-04-30 ENCOUNTER — Ambulatory Visit
Admission: RE | Admit: 2023-04-30 | Discharge: 2023-04-30 | Disposition: A | Discharge status: Home / Self Care | Payer: Medicare PPO | Source: Ambulatory Visit | Attending: Family Medicine | Admitting: Family Medicine

## 2023-04-30 DIAGNOSIS — Z1231 Encounter for screening mammogram for malignant neoplasm of breast: Secondary | ICD-10-CM

## 2024-03-16 ENCOUNTER — Other Ambulatory Visit: Payer: Self-pay | Admitting: Obstetrics and Gynecology

## 2024-03-16 DIAGNOSIS — Z1231 Encounter for screening mammogram for malignant neoplasm of breast: Secondary | ICD-10-CM

## 2024-05-03 ENCOUNTER — Ambulatory Visit

## 2024-05-11 ENCOUNTER — Ambulatory Visit
Admission: RE | Admit: 2024-05-11 | Discharge: 2024-05-11 | Disposition: A | Source: Ambulatory Visit | Attending: Obstetrics and Gynecology | Admitting: Obstetrics and Gynecology

## 2024-05-11 DIAGNOSIS — Z1231 Encounter for screening mammogram for malignant neoplasm of breast: Secondary | ICD-10-CM
